# Patient Record
Sex: Male | Born: 1970 | Race: White | Hispanic: No | Marital: Married | Smoking: Never smoker
Health system: Southern US, Community
[De-identification: ages and names within clinical notes are randomized; demographics above are authoritative.]

## PROBLEM LIST (undated history)

## (undated) DIAGNOSIS — K760 Fatty (change of) liver, not elsewhere classified: Secondary | ICD-10-CM

## (undated) DIAGNOSIS — Z8719 Personal history of other diseases of the digestive system: Secondary | ICD-10-CM

## (undated) DIAGNOSIS — Z87442 Personal history of urinary calculi: Secondary | ICD-10-CM

## (undated) DIAGNOSIS — E7800 Pure hypercholesterolemia, unspecified: Secondary | ICD-10-CM

## (undated) DIAGNOSIS — I10 Essential (primary) hypertension: Secondary | ICD-10-CM

## (undated) HISTORY — PX: HERNIA REPAIR: SHX51

## (undated) HISTORY — PX: GANGLION CYST EXCISION: SHX1691

---

## 2005-12-09 ENCOUNTER — Ambulatory Visit: Payer: Self-pay | Admitting: Unknown Physician Specialty

## 2007-04-02 ENCOUNTER — Emergency Department: Payer: Self-pay | Admitting: Emergency Medicine

## 2007-04-10 ENCOUNTER — Ambulatory Visit: Payer: Self-pay | Admitting: Urology

## 2007-06-15 ENCOUNTER — Ambulatory Visit: Payer: Self-pay | Admitting: Gastroenterology

## 2009-03-08 IMAGING — CR DG ABDOMEN 1V
1 series · 2 of 2 positions shown · non-contrast
Comparison: none

REASON FOR EXAM: ureteral calculus pt need films
COMMENTS:

PROCEDURE:     DXR - DXR KIDNEY URETER BLADDER  - April 10, 2007 [DATE]
RESULT:     The patient has had a 2 mm distal LEFT ureteral stone
demonstrated at prior CT. On current plain film examination, no definite
renal or ureteral stones are identified.

[Series 1: view not recorded · 0.17mm/px · 2 of 2 slices shown]
[im 1/2]
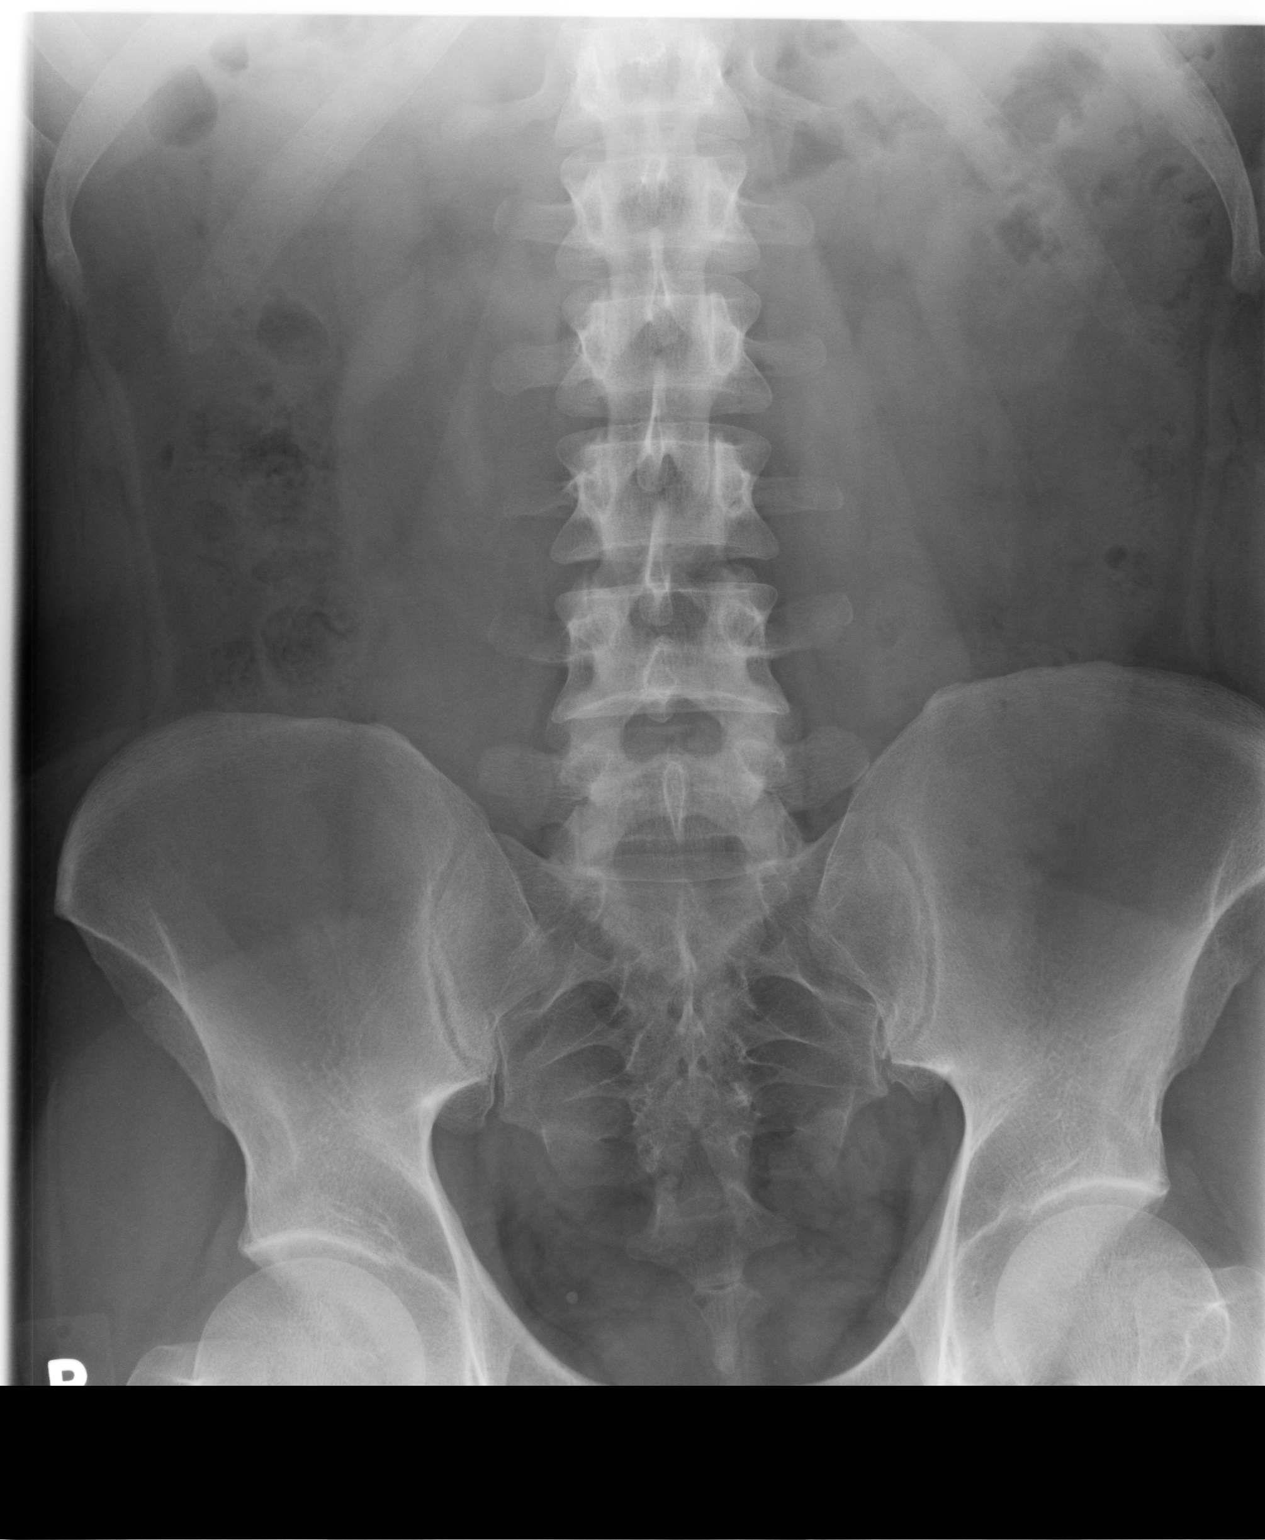
[im 2/2]
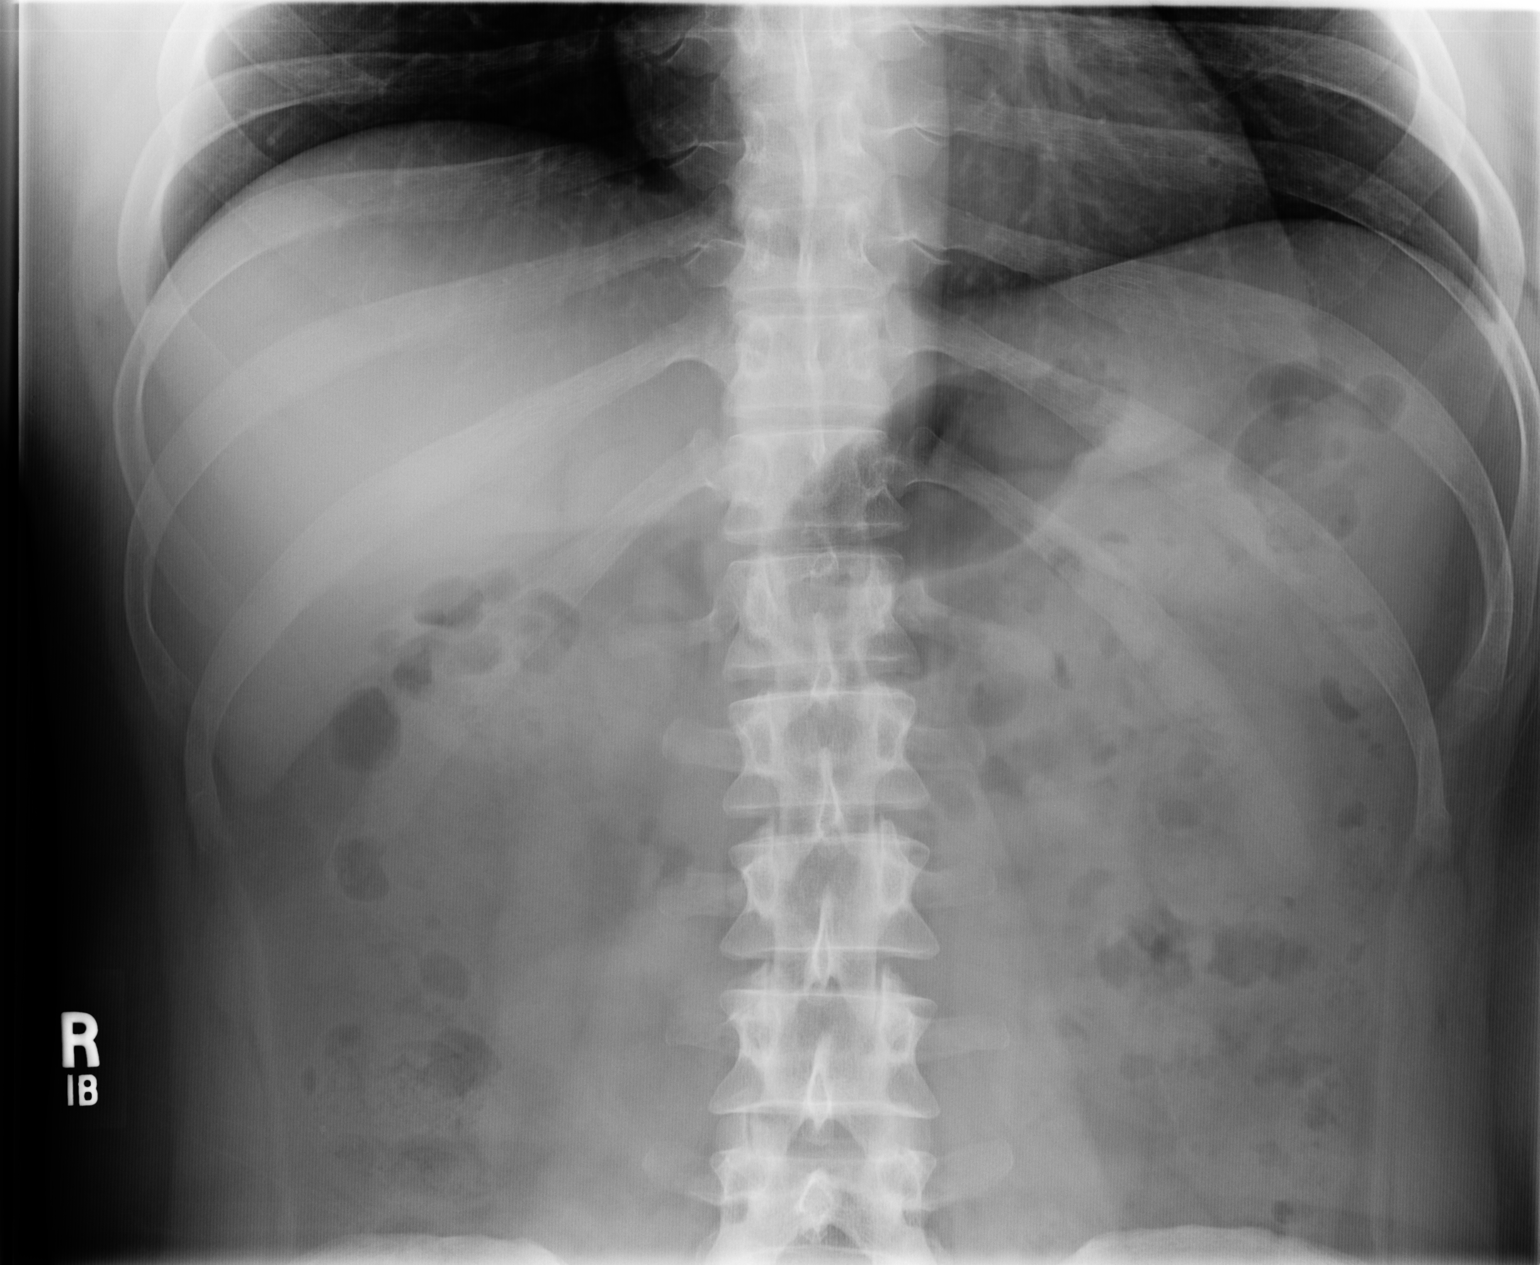

[2 of 2 positions shown; findings below may reference images not displayed]

IMPRESSION: 1.     Please see above.

## 2009-05-13 IMAGING — US ABDOMEN ULTRASOUND
1 series · 17 of 25 positions shown · non-contrast
Comparison: none

REASON FOR EXAM: Evaluate liver functions
COMMENTS:

[Series 1: abdomen ultrasound · 17 of 70 slices shown]
[im 1/70]
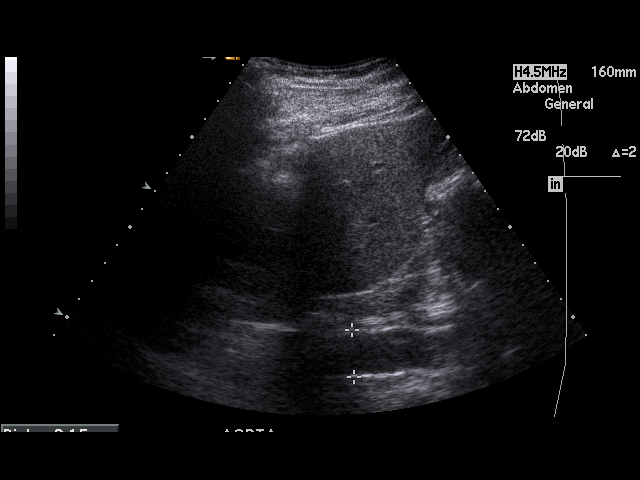
[im 6/70]
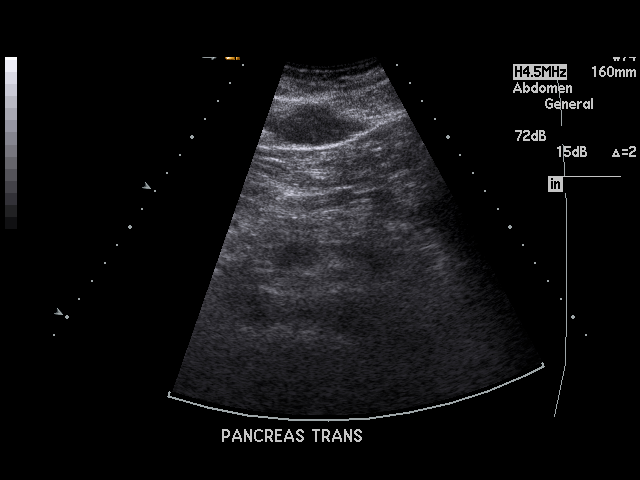
[im 9/70]
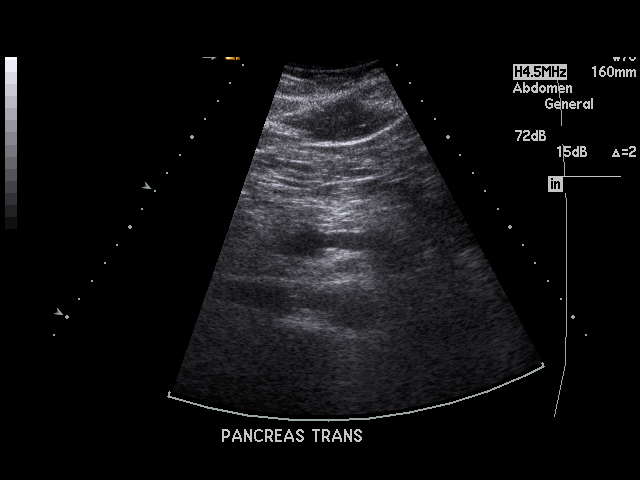
[im 15/70]
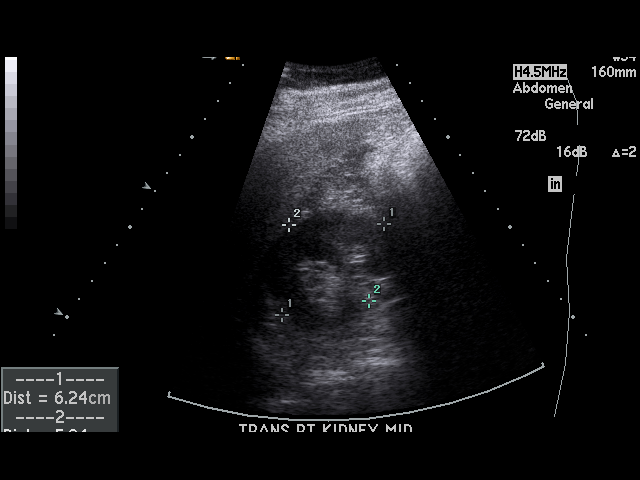
[im 18/70]
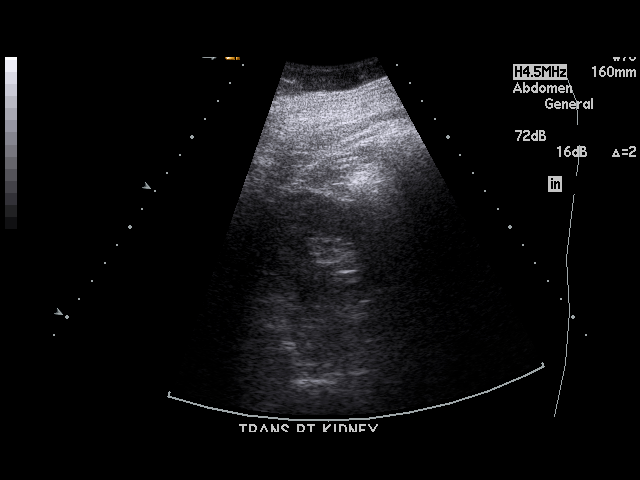
[im 24/70]
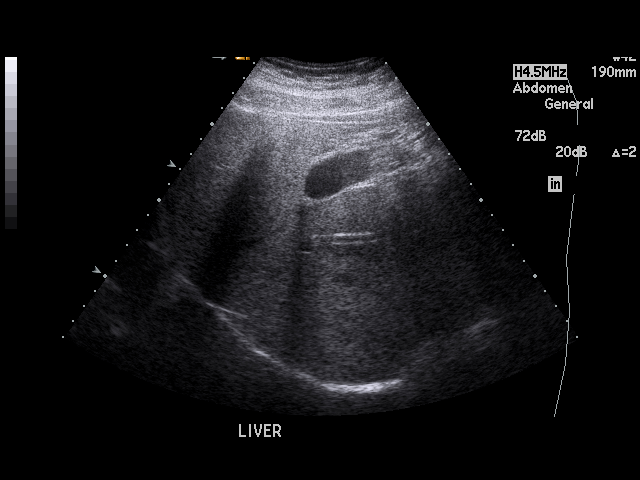
[im 26/70]
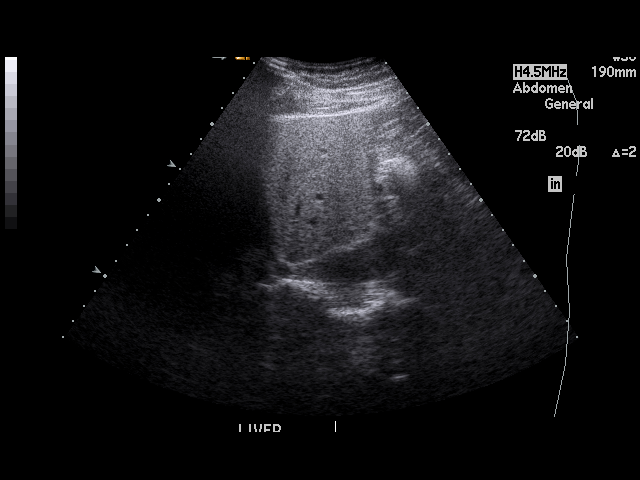
[im 32/70]
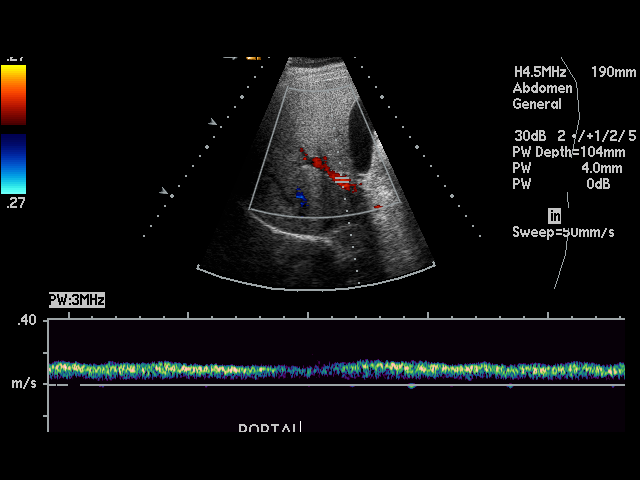
[im 35/70]
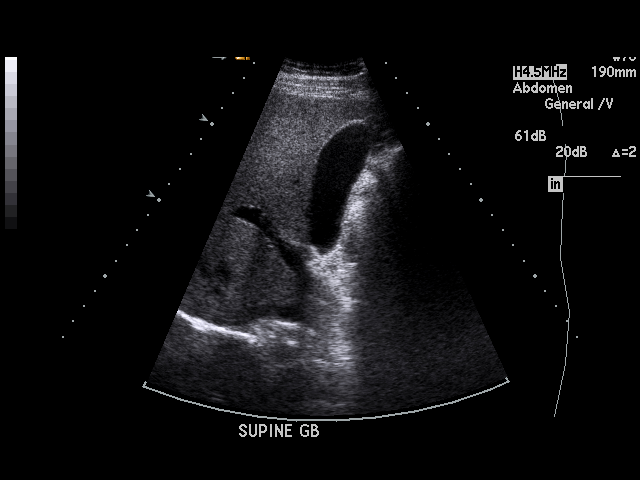
[im 38/70]
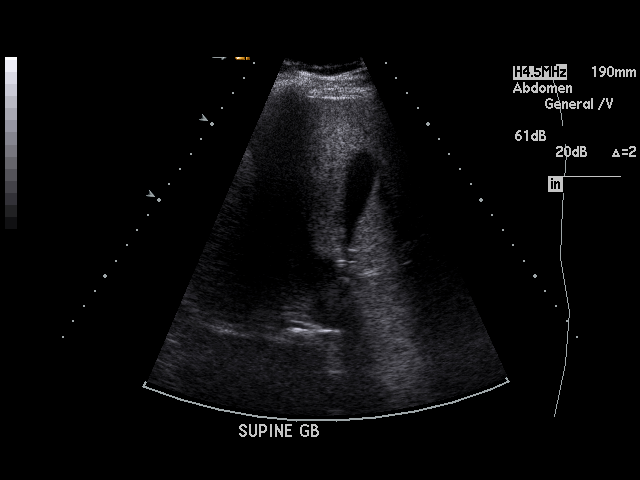
[im 44/70]
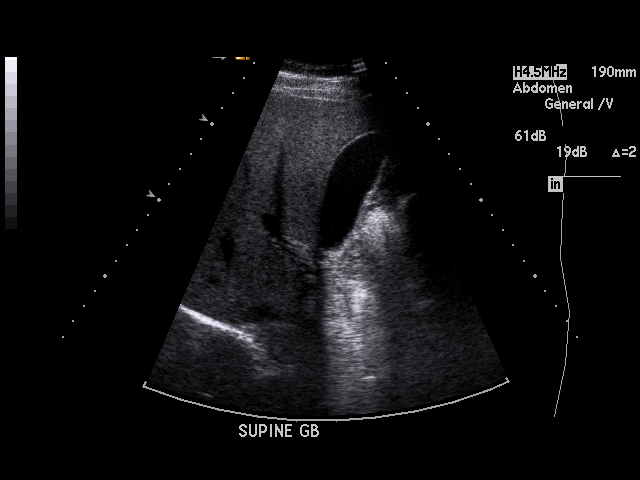
[im 47/70]
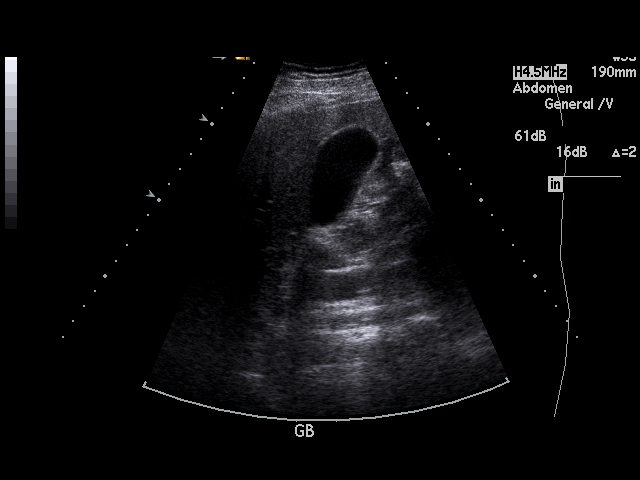
[im 52/70]
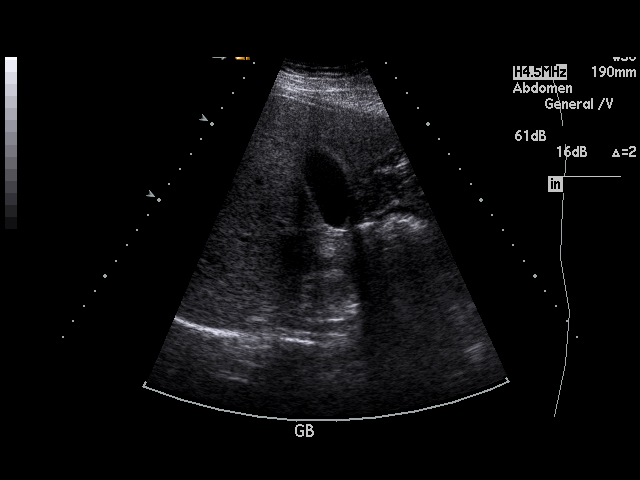
[im 55/70]
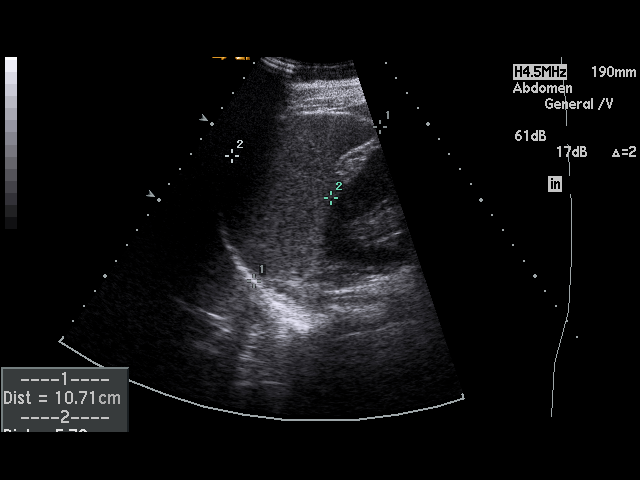
[im 61/70]
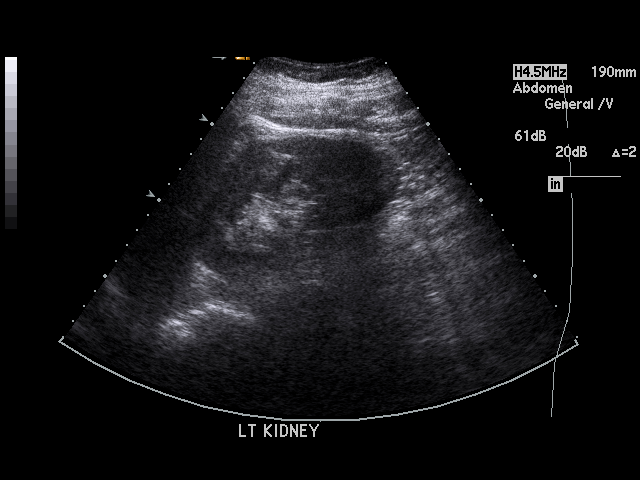
[im 64/70]
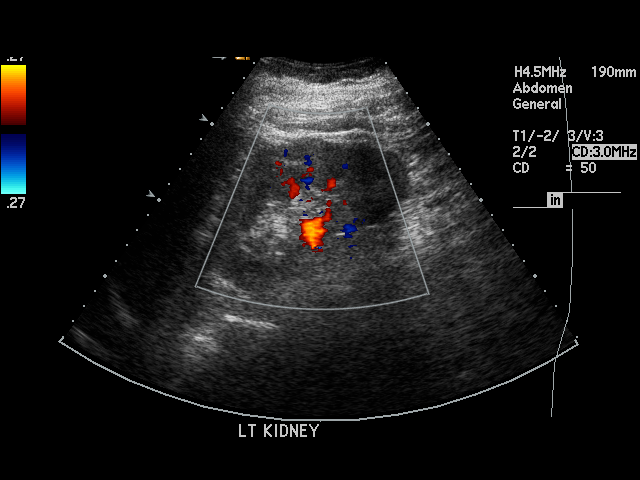
[im 70/70]
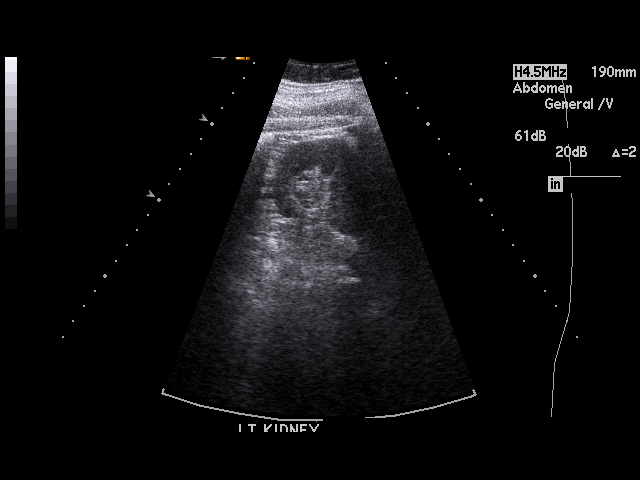

[17 of 25 positions shown; findings below may reference images not displayed]

PROCEDURE:     US  - US ABDOMEN GENERAL SURVEY  - June 15, 2007  [DATE]

RESULT:     The liver and spleen show no significant abnormalities. Portions
of the abdominal aorta are obscured but the visualized portions show no
significant abnormalities. No significant abnormalities of the inferior cava
are seen. The pancreas is not visualized adequately for evaluation on this
exam. No gallstones are seen. There is no thickening of the gallbladder
wall. The common bile duct measures 4.3 mm in diameter which is within
normal limits. The kidneys show no hydronephrosis. There is no ascites.
IMPRESSION: 1.  The pancreatic tail and portions of the abdominal aorta are obscured and
not fully visualized on this exam.
2.  No gallstones are seen.
3.  The kidneys show no hydronephrosis.
4.  There is no ascites.

## 2017-06-03 ENCOUNTER — Other Ambulatory Visit: Payer: Self-pay | Admitting: Nurse Practitioner

## 2017-06-03 DIAGNOSIS — R7989 Other specified abnormal findings of blood chemistry: Secondary | ICD-10-CM

## 2017-06-03 DIAGNOSIS — R945 Abnormal results of liver function studies: Principal | ICD-10-CM

## 2017-06-09 ENCOUNTER — Ambulatory Visit
Admission: RE | Admit: 2017-06-09 | Discharge: 2017-06-09 | Disposition: A | Discharge status: Home / Self Care | Payer: BLUE CROSS/BLUE SHIELD | Source: Ambulatory Visit | Attending: Nurse Practitioner | Admitting: Nurse Practitioner

## 2017-06-09 DIAGNOSIS — R945 Abnormal results of liver function studies: Secondary | ICD-10-CM | POA: Insufficient documentation

## 2017-06-09 DIAGNOSIS — R932 Abnormal findings on diagnostic imaging of liver and biliary tract: Secondary | ICD-10-CM | POA: Insufficient documentation

## 2017-06-09 DIAGNOSIS — R7989 Other specified abnormal findings of blood chemistry: Secondary | ICD-10-CM

## 2017-06-09 DIAGNOSIS — K8020 Calculus of gallbladder without cholecystitis without obstruction: Secondary | ICD-10-CM | POA: Diagnosis not present

## 2019-08-24 ENCOUNTER — Other Ambulatory Visit: Payer: Self-pay | Admitting: Nurse Practitioner

## 2019-08-24 DIAGNOSIS — K8011 Calculus of gallbladder with chronic cholecystitis with obstruction: Secondary | ICD-10-CM

## 2019-08-24 DIAGNOSIS — R1011 Right upper quadrant pain: Secondary | ICD-10-CM

## 2019-08-26 ENCOUNTER — Other Ambulatory Visit: Payer: Self-pay

## 2019-08-26 ENCOUNTER — Ambulatory Visit
Admission: RE | Admit: 2019-08-26 | Discharge: 2019-08-26 | Disposition: A | Discharge status: Home / Self Care | Payer: BC Managed Care – PPO | Source: Ambulatory Visit | Attending: Nurse Practitioner | Admitting: Nurse Practitioner

## 2019-08-26 DIAGNOSIS — R1011 Right upper quadrant pain: Secondary | ICD-10-CM | POA: Diagnosis present

## 2019-08-26 DIAGNOSIS — K8011 Calculus of gallbladder with chronic cholecystitis with obstruction: Secondary | ICD-10-CM | POA: Diagnosis present

## 2019-09-10 ENCOUNTER — Ambulatory Visit: Payer: Self-pay | Admitting: General Surgery

## 2019-09-10 NOTE — H&P (View-Only) (Signed)
PATIENT PROFILE: Brian Durham is a 49 y.o. male who presents to the Clinic for consultation at the request of Dr. Dayton Durham for evaluation of cholelithiasis.  PCP:  Brian Lange, NP  HISTORY OF PRESENT ILLNESS: Brian Durham reports having right upper quadrant pain since 22-monthago.  He reported that he has been having abdominal pain since few years ago but in the past few months he has been getting more frequently.  He also reports that the pain now is more localized in the right upper quadrant.  It starts on the epigastric area and radiates to the right upper quadrant.  He has identify aggravating factors such as big meals with marinara sauce.  There has been no alleviating factor.  Last episode was 2 weeks ago.  He reported that it was severe on the right upper quadrant and it lasted for 3 hours.  Tums, Pepto-Bismol and bicarbonate did not improve the pain.  Denies any nausea or vomiting.  Sometimes this vomiting makes the pain better but not that time.  Denies diarrhea.   PROBLEM LIST:         Problem List  Date Reviewed: 08/24/2019         Noted   Elevated LFTs 07/23/2015   Hypertension Unknown      GENERAL REVIEW OF SYSTEMS:   General ROS: negative for - chills, fatigue, fever, weight gain or weight loss Allergy and Immunology ROS: negative for - hives  Hematological and Lymphatic ROS: negative for - bleeding problems or bruising, negative for palpable nodes Endocrine ROS: negative for - heat or cold intolerance, hair changes Respiratory ROS: negative for - cough, shortness of breath or wheezing Cardiovascular ROS: no chest pain or palpitations GI ROS: negative for nausea, vomiting, diarrhea, constipation.  Positive for abdominal pain Musculoskeletal ROS: negative for - joint swelling or muscle pain Neurological ROS: negative for - confusion, syncope Dermatological ROS: negative for pruritus and rash Psychiatric: negative for anxiety, depression, difficulty sleeping  and memory loss  MEDICATIONS: Current Medications        Current Outpatient Medications  Medication Sig Dispense Refill  . lisinopriL (ZESTRIL) 10 MG tablet Take 1 tablet (10 mg total) by mouth once daily 90 tablet 3  . omeprazole (PRILOSEC) 40 MG DR capsule Take 1 capsule (40 mg total) by mouth once daily 90 capsule 0   No current facility-administered medications for this visit.       ALLERGIES: Patient has no known allergies.  PAST MEDICAL HISTORY:     Past Medical History:  Diagnosis Date  . Allergic rhinitis   . Elevated LFTs    +FANA.  Neg anti-smooth muscle antibody, antimitochondrial antibody.  Neg hepatitis markers.  . Elevated LFTs 2017  . Hypertension   . Hypertriglyceridemia   . Nephrolithiasis     PAST SURGICAL HISTORY:      Past Surgical History:  Procedure Laterality Date  . EXCISION GANGLION CYST WRIST PRIMARY Right   . HERNIA REPAIR       FAMILY HISTORY:      Family History  Problem Relation Age of Onset  . High blood pressure (Hypertension) Father   . Myocardial Infarction (Heart attack) Father        767,82 . High blood pressure (Hypertension) Mother   . Myocardial Infarction (Heart attack) Paternal Grandmother      SOCIAL HISTORY: Social History          Socioeconomic History  . Marital status: Married    Spouse name: Brian Durham .  Number of children: 1  . Years of education: Not on file  . Highest education level: Not on file  Occupational History  . 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 Financial resource strain: Not on file  . Food insecurity    Worry: Not on file    Inability: Not on file  . Transportation needs    Medical: Not on file    Non-medical: Not on file  Tobacco Use  . Smoking status: Never Smoker  . Smokeless tobacco: Never Used  Substance and Sexual Activity  . Alcohol use: No    Alcohol/week: 0.0 - 2.0 standard drinks  . Drug use: Never   . Sexual activity: Yes    Partners: Female    Birth control/protection: Condom, OCP  Other Topics Concern  . Not on file  Social History Narrative   Loves fishing & spends a lot of time fishing, on the water and snorkeling/scuba diving.      PHYSICAL EXAM:    Vitals:   09/10/19 0854  BP: 125/88  Pulse: 68   Body mass index is 26.78 kg/m. Weight: 87.1 kg (192 lb)   GENERAL: Alert, active, oriented x3  HEENT: Pupils equal reactive to light. Extraocular movements are intact. Sclera clear. Palpebral conjunctiva normal red color.Pharynx clear.  NECK: Supple with no palpable mass and no adenopathy.  LUNGS: Sound clear with no rales rhonchi or wheezes.  HEART: Regular rhythm S1 and S2 without murmur.  ABDOMEN: Soft and depressible, nontender with no palpable mass, no hepatomegaly.   EXTREMITIES: Well-developed well-nourished symmetrical with no dependent edema.  NEUROLOGICAL: Awake alert oriented, facial expression symmetrical, moving all extremities.  REVIEW OF DATA: I have reviewed the following data today:      Office Visit on 08/18/2019  Component Date Value  . WBC (White Blood Cell Co* 08/18/2019 5.3   . RBC (Red Blood Cell Coun* 08/18/2019 4.69   . Hemoglobin 08/18/2019 14.8   . Hematocrit 08/18/2019 43.7   . MCV (Mean Corpuscular Vo* 08/18/2019 93.2   . MCH (Mean Corpuscular He* 08/18/2019 31.6*  . MCHC (Mean Corpuscular H* 08/18/2019 33.9   . Platelet Count 08/18/2019 307   . RDW-CV (Red Cell Distrib* 08/18/2019 12.5   . MPV (Mean Platelet Volum* 08/18/2019 8.1*  . Neutrophils 08/18/2019 2.70   . Lymphocytes 08/18/2019 2.00   . Mixed Count 08/18/2019 0.60   . Neutrophil % 08/18/2019 51.0   . Lymphocyte % 08/18/2019 37.5   . Mixed % 08/18/2019 11.5   . Glucose 08/18/2019 98   . Sodium 08/18/2019 138   . Potassium 08/18/2019 4.8   . Chloride 08/18/2019 103   . Carbon Dioxide (CO2) 08/18/2019 28.9   . Urea Nitrogen (BUN) 08/18/2019 10    . Creatinine 08/18/2019 0.8   . Glomerular Filtration Ra* 08/18/2019 102   . Calcium 08/18/2019 10.0   . AST  08/18/2019 37   . ALT  08/18/2019 63*  . Alk Phos (alkaline Phosp* 08/18/2019 70   . Albumin 08/18/2019 4.9*  . Bilirubin, Total 08/18/2019 0.5   . Protein, Total 08/18/2019 7.4   . A/G Ratio 08/18/2019 2.0   . Cholesterol, Total 08/18/2019 207*  . Triglyceride 08/18/2019 132   . HDL (High Density Lipopr* 08/18/2019 58.9   . LDL Calculated 08/18/2019 122   . VLDL Cholesterol 08/18/2019 26   . Cholesterol/HDL Ratio 08/18/2019 3.5   . HCV Ab - [facility] 08/18/2019 <0.1   .   Hemoglobin A1C 08/18/2019 5.8*  . Average Blood Glucose (C* 08/18/2019 120   . Comment: - [facility] 08/18/2019 Comment      ASSESSMENT: Mr. Miah is a 49 y.o. male presenting for consultation for cholelithiasis.    Patient was oriented about the diagnosis of cholelithiasis. Also oriented about what is the gallbladder, its anatomy and function and the implications of having stones. The patient was oriented about the treatment alternatives (observation vs cholecystectomy). Patient was oriented that a Durham percentage of patient will continue to have similar pain symptoms even after the gallbladder is removed. Surgical technique (open vs laparoscopic) was discussed. It was also discussed the goals of the surgery (decrease the pain episodes and avoid the risk of cholecystitis) and the risk of surgery including: bleeding, infection, common bile duct injury, stone retention, injury to other organs such as bowel, liver, stomach, other complications such as hernia, bowel obstruction among others. Also discussed with patient about anesthesia and its complications such as: reaction to medications, pneumonia, heart complications, death, among others.  Cholelithiasis without cholecystitis [K80.20]  PLAN: 1. Robotic assisted laparoscopic cholecystectomy (47562) 2.  CBC, CMP done on 08/18/2019 3.  Do not take aspirin 5  days before the procedure 4.  Contact us if has any question or concern.  Patient verbalized understanding, all questions were answered, and were agreeable with the plan outlined above.     Amiria Orrison Cintron-Diaz, MD  Electronically signed by Yuleni Burich Cintron-Diaz, MD  

## 2019-09-10 NOTE — H&P (Signed)
PATIENT PROFILE: Brian Durham is a 49 y.o. male who presents to the Clinic for consultation at the request of Dr. Dayton Martes for evaluation of cholelithiasis.  PCP:  Sallee Lange, NP  HISTORY OF PRESENT ILLNESS: Brian Durham reports having right upper quadrant pain since 26-monthago.  He reported that he has been having abdominal pain since few years ago but in the past few months he has been getting more frequently.  He also reports that the pain now is more localized in the right upper quadrant.  It starts on the epigastric area and radiates to the right upper quadrant.  He has identify aggravating factors such as big meals with marinara sauce.  There has been no alleviating factor.  Last episode was 2 weeks ago.  He reported that it was severe on the right upper quadrant and it lasted for 3 hours.  Tums, Pepto-Bismol and bicarbonate did not improve the pain.  Denies any nausea or vomiting.  Sometimes this vomiting makes the pain better but not that time.  Denies diarrhea.   PROBLEM LIST:         Problem List  Date Reviewed: 08/24/2019         Noted   Elevated LFTs 07/23/2015   Hypertension Unknown      GENERAL REVIEW OF SYSTEMS:   General ROS: negative for - chills, fatigue, fever, weight gain or weight loss Allergy and Immunology ROS: negative for - hives  Hematological and Lymphatic ROS: negative for - bleeding problems or bruising, negative for palpable nodes Endocrine ROS: negative for - heat or cold intolerance, hair changes Respiratory ROS: negative for - cough, shortness of breath or wheezing Cardiovascular ROS: no chest pain or palpitations GI ROS: negative for nausea, vomiting, diarrhea, constipation.  Positive for abdominal pain Musculoskeletal ROS: negative for - joint swelling or muscle pain Neurological ROS: negative for - confusion, syncope Dermatological ROS: negative for pruritus and rash Psychiatric: negative for anxiety, depression, difficulty sleeping  and memory loss  MEDICATIONS: Current Medications        Current Outpatient Medications  Medication Sig Dispense Refill  . lisinopriL (ZESTRIL) 10 MG tablet Take 1 tablet (10 mg total) by mouth once daily 90 tablet 3  . omeprazole (PRILOSEC) 40 MG DR capsule Take 1 capsule (40 mg total) by mouth once daily 90 capsule 0   No current facility-administered medications for this visit.       ALLERGIES: Patient has no known allergies.  PAST MEDICAL HISTORY:     Past Medical History:  Diagnosis Date  . Allergic rhinitis   . Elevated LFTs    +FANA.  Neg anti-smooth muscle antibody, antimitochondrial antibody.  Neg hepatitis markers.  . Elevated LFTs 2017  . Hypertension   . Hypertriglyceridemia   . Nephrolithiasis     PAST SURGICAL HISTORY:      Past Surgical History:  Procedure Laterality Date  . EXCISION GANGLION CYST WRIST PRIMARY Right   . HERNIA REPAIR       FAMILY HISTORY:      Family History  Problem Relation Age of Onset  . High blood pressure (Hypertension) Father   . Myocardial Infarction (Heart attack) Father        759,82 . High blood pressure (Hypertension) Mother   . Myocardial Infarction (Heart attack) Paternal Grandmother      SOCIAL HISTORY: Social History          Socioeconomic History  . Marital status: Married    Spouse name: CAlyse Low .  Number of children: 1  . Years of education: Not on file  . Highest education level: Not on file  Occupational History  . 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 Financial resource strain: Not on file  . Food insecurity    Worry: Not on file    Inability: Not on file  . Transportation needs    Medical: Not on file    Non-medical: Not on file  Tobacco Use  . Smoking status: Never Smoker  . Smokeless tobacco: Never Used  Substance and Sexual Activity  . Alcohol use: No    Alcohol/week: 0.0 - 2.0 standard drinks  . Drug use: Never   . Sexual activity: Yes    Partners: Female    Birth control/protection: Condom, OCP  Other Topics Concern  . Not on file  Social History Narrative   Loves fishing & spends a lot of time fishing, on the water and snorkeling/scuba diving.      PHYSICAL EXAM:    Vitals:   09/10/19 0854  BP: 125/88  Pulse: 68   Body mass index is 26.78 kg/m. Weight: 87.1 kg (192 lb)   GENERAL: Alert, active, oriented x3  HEENT: Pupils equal reactive to light. Extraocular movements are intact. Sclera clear. Palpebral conjunctiva normal red color.Pharynx clear.  NECK: Supple with no palpable mass and no adenopathy.  LUNGS: Sound clear with no rales rhonchi or wheezes.  HEART: Regular rhythm S1 and S2 without murmur.  ABDOMEN: Soft and depressible, nontender with no palpable mass, no hepatomegaly.   EXTREMITIES: Well-developed well-nourished symmetrical with no dependent edema.  NEUROLOGICAL: Awake alert oriented, facial expression symmetrical, moving all extremities.  REVIEW OF DATA: I have reviewed the following data today:      Office Visit on 08/18/2019  Component Date Value  . WBC (White Blood Cell Co* 08/18/2019 5.3   . RBC (Red Blood Cell Coun* 08/18/2019 4.69   . Hemoglobin 08/18/2019 14.8   . Hematocrit 08/18/2019 43.7   . MCV (Mean Corpuscular Vo* 08/18/2019 93.2   . MCH (Mean Corpuscular He* 08/18/2019 31.6*  . MCHC (Mean Corpuscular H* 08/18/2019 33.9   . Platelet Count 08/18/2019 307   . RDW-CV (Red Cell Distrib* 08/18/2019 12.5   . MPV (Mean Platelet Volum* 08/18/2019 8.1*  . Neutrophils 08/18/2019 2.70   . Lymphocytes 08/18/2019 2.00   . Mixed Count 08/18/2019 0.60   . Neutrophil % 08/18/2019 51.0   . Lymphocyte % 08/18/2019 37.5   . Mixed % 08/18/2019 11.5   . Glucose 08/18/2019 98   . Sodium 08/18/2019 138   . Potassium 08/18/2019 4.8   . Chloride 08/18/2019 103   . Carbon Dioxide (CO2) 08/18/2019 28.9   . Urea Nitrogen (BUN) 08/18/2019 10    . Creatinine 08/18/2019 0.8   . Glomerular Filtration Ra* 08/18/2019 102   . Calcium 08/18/2019 10.0   . AST  08/18/2019 37   . ALT  08/18/2019 63*  . Alk Phos (alkaline Phosp* 08/18/2019 70   . Albumin 08/18/2019 4.9*  . Bilirubin, Total 08/18/2019 0.5   . Protein, Total 08/18/2019 7.4   . A/G Ratio 08/18/2019 2.0   . Cholesterol, Total 08/18/2019 207*  . Triglyceride 08/18/2019 132   . HDL (High Density Lipopr* 08/18/2019 58.9   . LDL Calculated 08/18/2019 122   . VLDL Cholesterol 08/18/2019 26   . Cholesterol/HDL Ratio 08/18/2019 3.5   . HCV Ab - [facility] 08/18/2019 <0.1   .   Hemoglobin A1C 08/18/2019 5.8*  . Average Blood Glucose (C* 08/18/2019 120   . Comment: - [facility] 08/18/2019 Comment      ASSESSMENT: Mr. Tigert is a 49 y.o. male presenting for consultation for cholelithiasis.    Patient was oriented about the diagnosis of cholelithiasis. Also oriented about what is the gallbladder, its anatomy and function and the implications of having stones. The patient was oriented about the treatment alternatives (observation vs cholecystectomy). Patient was oriented that a low percentage of patient will continue to have similar pain symptoms even after the gallbladder is removed. Surgical technique (open vs laparoscopic) was discussed. It was also discussed the goals of the surgery (decrease the pain episodes and avoid the risk of cholecystitis) and the risk of surgery including: bleeding, infection, common bile duct injury, stone retention, injury to other organs such as bowel, liver, stomach, other complications such as hernia, bowel obstruction among others. Also discussed with patient about anesthesia and its complications such as: reaction to medications, pneumonia, heart complications, death, among others.  Cholelithiasis without cholecystitis [K80.20]  PLAN: 1. Robotic assisted laparoscopic cholecystectomy (73710) 2.  CBC, CMP done on 08/18/2019 3.  Do not take aspirin 5  days before the procedure 4.  Contact us if has any question or concern.  Patient verbalized understanding, all questions were answered, and were agreeable with the plan outlined above.     Herbert Pun, MD  Electronically signed by Herbert Pun, MD

## 2019-09-16 ENCOUNTER — Other Ambulatory Visit: Payer: Self-pay

## 2019-09-16 ENCOUNTER — Encounter
Admission: RE | Admit: 2019-09-16 | Discharge: 2019-09-16 | Disposition: A | Discharge status: Home / Self Care | Payer: BC Managed Care – PPO | Source: Ambulatory Visit | Attending: General Surgery | Admitting: General Surgery

## 2019-09-16 DIAGNOSIS — Z01818 Encounter for other preprocedural examination: Secondary | ICD-10-CM | POA: Diagnosis present

## 2019-09-16 NOTE — Patient Instructions (Addendum)
INSTRUCTIONS FOR SURGERY     Your surgery is scheduled for:   Wednesday, MARCH 3RD     To find out your arrival time for the day of surgery,          please call [phone] between 1 pm and 3 pm on :  Tuesday, MARCH 2ND     When you arrive for surgery, report to the SECOND FLOOR OF THE MEDICAL MALL.       Do NOT stop on the first floor to register.    REMEMBER: Instructions that are not followed completely may result in serious medical risk,  up to and including death, or upon the discretion of your surgeon and anesthesiologist,            your surgery may need to be rescheduled.  __X__ 1. Do not eat food after midnight the night before your procedure.                    No gum, candy, lozenger, tic tacs, tums or hard candies.                  ABSOLUTELY NOTHING SOLID IN YOUR MOUTH AFTER MIDNIGHT                    You may drink unlimited clear liquids up to 2 hours before you are scheduled to arrive for surgery.                   Do not drink anything within those 2 hours unless you need to take medicine, then take the                   smallest amount you need.  Clear liquids include:  water, apple juice without pulp,                   any flavor Gatorade, Black coffee, black tea.  Sugar may be added but no dairy/ honey /lemon.                        Broth and jello is not considered a clear liquid.  __x__  2. On the morning of surgery, please brush your teeth with toothpaste and water. You may rinse                   with mouthwash if you wish but DO NOT SWALLOW TOOTHPASTE OR MOUTHWASH  __X___3. NO alcohol for 24 hours before or after surgery.  __x___ 4.  Do NOT smoke or use e-cigarettes for 24 HOURS PRIOR TO SURGERY.                      DO NOT Use any chewable tobacco products for at least 6 hours prior to surgery.  __x___ 5. If you start any new medication after this appointment and prior to surgery, please                    Bring it with you on the day of surgery.  ___x__ 6. Notify your doctor if there is any change  in your medical condition, such as fever,                   infection, vomitting, diarrhea or any open sores.  __x___ 7.  USE the CHG SOAP as instructed, the night before surgery and the day of surgery.                   Once you have washed with this soap, do NOT use any of the following: Powders, perfumes                    or lotions. Please do not wear make up, hairpins, clips or nail polish. You may wear deodorant.                   Men may shave their face and neck.  Women need to shave 48 hours prior to surgery.                   DO NOT wear ANY jewelry on the day of surgery. If there are rings that are too tight to                    remove easily, please address this prior to the surgery day. Piercings need to be removed.                                                                     NO METAL ON YOUR BODY.                    Do NOT bring any valuables.  If you came to Pre-Admit testing then you will not need license,                     insurance card or credit card.  If you will be staying overnight, please either leave your things in                     the car or have your family be responsible for these items.                     [facility] IS NOT RESPONSIBLE FOR BELONGINGS OR VALUABLES.  ___X__ 8. DO NOT wear contact lenses on surgery day.  You may not have dentures,                     Hearing aides, contacts or glasses in the operating room. These items can be                    Placed in the Recovery Room to receive immediately after surgery.  __x___ 9. IF YOU ARE SCHEDULED TO GO HOME ON THE SAME DAY, YOU MUST                   Have someone to drive you home and to stay with you  for the first 24 hours.                    Have an arrangement prior to arriving on surgery day.  ___x__ 10. Take the following medications  on the morning of surgery with a sip of water:                               1.  NOTHING                     2.                     3.                  _____ 11.  Follow any instructions provided to you by your surgeon.                        Such as enema, clear liquid bowel prep  __X__  12. STOP  ASPIRIN AS OF:  Thursday, February 25TH                       THIS INCLUDES BC POWDERS / GOODIES POWDER  __x___ 13. STOP Anti-inflammatories as of: Thursday, February 25TH                      This includes IBUPROFEN / MOTRIN / ADVIL / ALEVE/ NAPROXYN                    YOU MAY TAKE TYLENOL ANY TIME PRIOR TO SURGERY.  __X___ 78.  Stop supplements until after surgery.                     This includes:  MULTIVITAMINS                 You may continue taking Vitamin B12 / Vitamin D3 but do not take on the morning of surgery.  ___x___17.  Continue to take the following medications but do not take on the morning of surgery:                            LISINOPRIL // VITAMIN B12   ___x___18.   Wear clean and comfortable clothing to the hospital.  Have stretchy pants on.  Consider having stool softeners at home and begin 2 days prior to surgery. You will need this if you     Take pain medication on a regular basis after surgery. Have a firm pillow to hold against your abdomen when coughing, sneezing, laughing (after surgery)

## 2019-09-17 ENCOUNTER — Other Ambulatory Visit
Admission: RE | Admit: 2019-09-17 | Discharge: 2019-09-17 | Disposition: A | Discharge status: Home / Self Care | Payer: BC Managed Care – PPO | Source: Ambulatory Visit | Attending: General Surgery | Admitting: General Surgery

## 2019-09-17 HISTORY — DX: Essential (primary) hypertension: I10

## 2019-09-17 HISTORY — DX: Fatty (change of) liver, not elsewhere classified: K76.0

## 2019-09-17 HISTORY — DX: Pure hypercholesterolemia, unspecified: E78.00

## 2019-09-17 HISTORY — DX: Personal history of other diseases of the digestive system: Z87.19

## 2019-09-17 HISTORY — DX: Personal history of urinary calculi: Z87.442

## 2019-09-20 ENCOUNTER — Other Ambulatory Visit: Payer: Self-pay

## 2019-09-20 ENCOUNTER — Encounter
Admission: RE | Admit: 2019-09-20 | Discharge: 2019-09-20 | Disposition: A | Discharge status: Home / Self Care | Payer: BC Managed Care – PPO | Source: Ambulatory Visit | Attending: General Surgery | Admitting: General Surgery

## 2019-09-20 DIAGNOSIS — Z20822 Contact with and (suspected) exposure to covid-19: Secondary | ICD-10-CM | POA: Diagnosis not present

## 2019-09-20 DIAGNOSIS — I10 Essential (primary) hypertension: Secondary | ICD-10-CM | POA: Insufficient documentation

## 2019-09-20 DIAGNOSIS — Z01818 Encounter for other preprocedural examination: Secondary | ICD-10-CM | POA: Insufficient documentation

## 2019-09-21 LAB — SARS CORONAVIRUS 2 (TAT 6-24 HRS): SARS Coronavirus 2: NEGATIVE

## 2019-09-21 MED ORDER — INDOCYANINE GREEN 25 MG IV SOLR
1.2500 mg | Freq: Once | INTRAVENOUS | Status: DC
  Filled 2019-09-21: qty 10

## 2019-09-22 ENCOUNTER — Encounter: Payer: Self-pay | Admitting: General Surgery

## 2019-09-22 ENCOUNTER — Ambulatory Visit: Payer: BC Managed Care – PPO | Admitting: Registered Nurse

## 2019-09-22 ENCOUNTER — Ambulatory Visit
Admission: RE | Admit: 2019-09-22 | Discharge: 2019-09-22 | Disposition: A | Discharge status: Home / Self Care | Payer: BC Managed Care – PPO | Attending: General Surgery | Admitting: General Surgery

## 2019-09-22 ENCOUNTER — Other Ambulatory Visit: Payer: Self-pay

## 2019-09-22 ENCOUNTER — Encounter
Admission: RE | Disposition: A | Discharge status: Home / Self Care | Payer: Self-pay | Source: Home / Self Care | Attending: General Surgery

## 2019-09-22 DIAGNOSIS — K8010 Calculus of gallbladder with chronic cholecystitis without obstruction: Secondary | ICD-10-CM | POA: Diagnosis present

## 2019-09-22 DIAGNOSIS — I10 Essential (primary) hypertension: Secondary | ICD-10-CM | POA: Diagnosis not present

## 2019-09-22 DIAGNOSIS — Z79899 Other long term (current) drug therapy: Secondary | ICD-10-CM | POA: Diagnosis not present

## 2019-09-22 SURGERY — CHOLECYSTECTOMY, ROBOT-ASSISTED, LAPAROSCOPIC
Anesthesia: General | Site: Abdomen

## 2019-09-22 MED ORDER — HYDROCODONE-ACETAMINOPHEN 5-325 MG PO TABS: 1.0000 | ORAL_TABLET | ORAL | 0 refills | Status: AC | PRN

## 2019-09-22 MED ORDER — FENTANYL CITRATE (PF) 100 MCG/2ML IJ SOLN
INTRAMUSCULAR | Status: AC
  Administered 2019-09-22: 25 ug via INTRAVENOUS
  Filled 2019-09-22: qty 2

## 2019-09-22 MED ORDER — SODIUM CHLORIDE 0.9 % IV SOLN
INTRAVENOUS | Status: DC | PRN
  Administered 2019-09-22: 25 ug/min via INTRAVENOUS

## 2019-09-22 MED ORDER — MEPERIDINE HCL 50 MG/ML IJ SOLN: 6.2500 mg | INTRAMUSCULAR | Status: DC | PRN

## 2019-09-22 MED ORDER — FENTANYL CITRATE (PF) 100 MCG/2ML IJ SOLN
INTRAMUSCULAR | Status: DC | PRN
  Administered 2019-09-22 (×3): 25 ug via INTRAVENOUS
  Administered 2019-09-22: 50 ug via INTRAVENOUS

## 2019-09-22 MED ORDER — OXYCODONE HCL 5 MG PO TABS
ORAL_TABLET | ORAL | Status: AC
  Filled 2019-09-22: qty 1

## 2019-09-22 MED ORDER — EPINEPHRINE PF 1 MG/ML IJ SOLN
INTRAMUSCULAR | Status: AC
  Filled 2019-09-22: qty 1

## 2019-09-22 MED ORDER — BUPIVACAINE HCL (PF) 0.25 % IJ SOLN
INTRAMUSCULAR | Status: AC
  Filled 2019-09-22: qty 30

## 2019-09-22 MED ORDER — SODIUM CHLORIDE (PF) 0.9 % IJ SOLN
INTRAMUSCULAR | Status: AC
  Filled 2019-09-22: qty 10

## 2019-09-22 MED ORDER — KETAMINE HCL 10 MG/ML IJ SOLN
INTRAMUSCULAR | Status: DC | PRN
  Administered 2019-09-22: 30 mg via INTRAVENOUS

## 2019-09-22 MED ORDER — ROCURONIUM BROMIDE 50 MG/5ML IV SOLN
INTRAVENOUS | Status: AC
  Filled 2019-09-22: qty 1

## 2019-09-22 MED ORDER — ONDANSETRON HCL 4 MG/2ML IJ SOLN
INTRAMUSCULAR | Status: DC | PRN
  Administered 2019-09-22: 4 mg via INTRAVENOUS

## 2019-09-22 MED ORDER — BUPIVACAINE-EPINEPHRINE 0.25% -1:200000 IJ SOLN
INTRAMUSCULAR | Status: DC | PRN
  Administered 2019-09-22: 20 mL
  Administered 2019-09-22: 10 mL

## 2019-09-22 MED ORDER — ACETAMINOPHEN 10 MG/ML IV SOLN
INTRAVENOUS | Status: AC
  Filled 2019-09-22: qty 100

## 2019-09-22 MED ORDER — ROCURONIUM BROMIDE 100 MG/10ML IV SOLN
INTRAVENOUS | Status: DC | PRN
  Administered 2019-09-22: 50 mg via INTRAVENOUS
  Administered 2019-09-22: 10 mg via INTRAVENOUS

## 2019-09-22 MED ORDER — PROPOFOL 10 MG/ML IV BOLUS
INTRAVENOUS | Status: DC | PRN
  Administered 2019-09-22: 150 mg via INTRAVENOUS

## 2019-09-22 MED ORDER — LIDOCAINE HCL (PF) 2 % IJ SOLN
INTRAMUSCULAR | Status: AC
  Filled 2019-09-22: qty 5

## 2019-09-22 MED ORDER — LACTATED RINGERS IV SOLN: INTRAVENOUS | Status: DC

## 2019-09-22 MED ORDER — CEFAZOLIN SODIUM-DEXTROSE 2-4 GM/100ML-% IV SOLN
INTRAVENOUS | Status: AC
  Filled 2019-09-22: qty 100

## 2019-09-22 MED ORDER — SUGAMMADEX SODIUM 200 MG/2ML IV SOLN
INTRAVENOUS | Status: DC | PRN
  Administered 2019-09-22: 200 mg via INTRAVENOUS

## 2019-09-22 MED ORDER — MIDAZOLAM HCL 2 MG/2ML IJ SOLN
INTRAMUSCULAR | Status: DC | PRN
  Administered 2019-09-22: 2 mg via INTRAVENOUS

## 2019-09-22 MED ORDER — MIDAZOLAM HCL 2 MG/2ML IJ SOLN
INTRAMUSCULAR | Status: AC
  Filled 2019-09-22: qty 2

## 2019-09-22 MED ORDER — ACETAMINOPHEN 10 MG/ML IV SOLN
INTRAVENOUS | Status: DC | PRN
  Administered 2019-09-22: 1000 mg via INTRAVENOUS

## 2019-09-22 MED ORDER — OXYCODONE HCL 5 MG PO TABS
5.0000 mg | ORAL_TABLET | Freq: Once | ORAL | Status: AC | PRN
  Administered 2019-09-22: 5 mg via ORAL

## 2019-09-22 MED ORDER — DEXAMETHASONE SODIUM PHOSPHATE 10 MG/ML IJ SOLN
INTRAMUSCULAR | Status: DC | PRN
  Administered 2019-09-22: 10 mg via INTRAVENOUS

## 2019-09-22 MED ORDER — SUGAMMADEX SODIUM 200 MG/2ML IV SOLN
INTRAVENOUS | Status: AC
  Filled 2019-09-22: qty 2

## 2019-09-22 MED ORDER — GLYCOPYRROLATE 0.2 MG/ML IJ SOLN
INTRAMUSCULAR | Status: DC | PRN
  Administered 2019-09-22: .2 mg via INTRAVENOUS

## 2019-09-22 MED ORDER — PROMETHAZINE HCL 25 MG/ML IJ SOLN: 6.2500 mg | INTRAMUSCULAR | Status: DC | PRN

## 2019-09-22 MED ORDER — LIDOCAINE HCL (CARDIAC) PF 100 MG/5ML IV SOSY
PREFILLED_SYRINGE | INTRAVENOUS | Status: DC | PRN
  Administered 2019-09-22: 100 mg via INTRAVENOUS

## 2019-09-22 MED ORDER — FAMOTIDINE 20 MG PO TABS: 20.0000 mg | ORAL_TABLET | Freq: Once | ORAL | Status: DC

## 2019-09-22 MED ORDER — PROPOFOL 10 MG/ML IV BOLUS
INTRAVENOUS | Status: AC
  Filled 2019-09-22: qty 20

## 2019-09-22 MED ORDER — PHENYLEPHRINE HCL (PRESSORS) 10 MG/ML IV SOLN
INTRAVENOUS | Status: DC | PRN
  Administered 2019-09-22: 50 ug via INTRAVENOUS
  Administered 2019-09-22: 100 ug via INTRAVENOUS

## 2019-09-22 MED ORDER — FENTANYL CITRATE (PF) 250 MCG/5ML IJ SOLN
INTRAMUSCULAR | Status: AC
  Filled 2019-09-22: qty 5

## 2019-09-22 MED ORDER — CEFAZOLIN SODIUM-DEXTROSE 2-4 GM/100ML-% IV SOLN
2.0000 g | INTRAVENOUS | Status: AC
  Administered 2019-09-22: 2 g via INTRAVENOUS

## 2019-09-22 MED ORDER — ONDANSETRON HCL 4 MG/2ML IJ SOLN
INTRAMUSCULAR | Status: AC
  Filled 2019-09-22: qty 2

## 2019-09-22 MED ORDER — OXYCODONE HCL 5 MG/5ML PO SOLN: 5.0000 mg | Freq: Once | ORAL | Status: AC | PRN

## 2019-09-22 MED ORDER — FENTANYL CITRATE (PF) 100 MCG/2ML IJ SOLN
25.0000 ug | INTRAMUSCULAR | Status: AC | PRN
  Administered 2019-09-22 (×4): 25 ug via INTRAVENOUS

## 2019-09-22 MED ORDER — EPHEDRINE SULFATE 50 MG/ML IJ SOLN
INTRAMUSCULAR | Status: DC | PRN
  Administered 2019-09-22 (×2): 5 mg via INTRAVENOUS

## 2019-09-22 MED ORDER — SODIUM CHLORIDE FLUSH 0.9 % IV SOLN
INTRAVENOUS | Status: AC
  Filled 2019-09-22: qty 10

## 2019-09-22 MED ORDER — DEXAMETHASONE SODIUM PHOSPHATE 10 MG/ML IJ SOLN
INTRAMUSCULAR | Status: AC
  Filled 2019-09-22: qty 1

## 2019-09-22 SURGICAL SUPPLY — 59 items
"PENCIL ELECTRO HAND CTR " (MISCELLANEOUS) IMPLANT
BLADE SURG SZ11 CARB STEEL (BLADE) ×4 IMPLANT
CANISTER SUCT 1200ML W/VALVE (MISCELLANEOUS) ×4 IMPLANT
CANNULA REDUC XI 12-8 STAPL (CANNULA) ×1
CANNULA REDUC XI 12-8MM STAPL (CANNULA) ×1
CANNULA REDUCER 12-8 DVNC XI (CANNULA) ×2 IMPLANT
CHLORAPREP W/TINT 26 (MISCELLANEOUS) ×4 IMPLANT
CLIP VESOLOCK MED LG 6/CT (CLIP) ×4 IMPLANT
COVER TIP SHEARS 8 DVNC (MISCELLANEOUS) IMPLANT
COVER TIP SHEARS 8MM DA VINCI (MISCELLANEOUS)
COVER WAND RF STERILE (DRAPES) ×4 IMPLANT
DECANTER SPIKE VIAL GLASS SM (MISCELLANEOUS) ×8 IMPLANT
DEFOGGER SCOPE WARMER CLEARIFY (MISCELLANEOUS) ×4 IMPLANT
DERMABOND ADVANCED (GAUZE/BANDAGES/DRESSINGS) ×2
DERMABOND ADVANCED .7 DNX12 (GAUZE/BANDAGES/DRESSINGS) ×2 IMPLANT
DRAPE ARM DVNC X/XI (DISPOSABLE) ×8 IMPLANT
DRAPE COLUMN DVNC XI (DISPOSABLE) ×2 IMPLANT
DRAPE DA VINCI XI ARM (DISPOSABLE) ×8
DRAPE DA VINCI XI COLUMN (DISPOSABLE) ×2
ELECT CAUTERY BLADE 6.4 (BLADE) IMPLANT
ELECT REM PT RETURN 9FT ADLT (ELECTROSURGICAL) ×4
ELECTRODE REM PT RTRN 9FT ADLT (ELECTROSURGICAL) ×2 IMPLANT
GLOVE BIO SURGEON STRL SZ 6.5 (GLOVE) ×6 IMPLANT
GLOVE BIO SURGEONS STRL SZ 6.5 (GLOVE) ×2
GLOVE BIOGEL PI IND STRL 6.5 (GLOVE) ×4 IMPLANT
GLOVE BIOGEL PI INDICATOR 6.5 (GLOVE) ×4
GOWN STRL REUS W/ TWL LRG LVL3 (GOWN DISPOSABLE) ×6 IMPLANT
GOWN STRL REUS W/TWL LRG LVL3 (GOWN DISPOSABLE) ×6
GRASPER SUT TROCAR 14GX15 (MISCELLANEOUS) ×2 IMPLANT
IRRIGATOR SUCT 8 DISP DVNC XI (IRRIGATION / IRRIGATOR) IMPLANT
IRRIGATOR SUCTION 8MM XI DISP (IRRIGATION / IRRIGATOR)
IV NS 1000ML (IV SOLUTION)
IV NS 1000ML BAXH (IV SOLUTION) IMPLANT
KIT PINK PAD W/HEAD ARE REST (MISCELLANEOUS) ×4
KIT PINK PAD W/HEAD ARM REST (MISCELLANEOUS) ×2 IMPLANT
LABEL OR SOLS (LABEL) ×4 IMPLANT
NDL INSUFFLATION 14GA 120MM (NEEDLE) ×2 IMPLANT
NEEDLE HYPO 22GX1.5 SAFETY (NEEDLE) ×4 IMPLANT
NEEDLE INSUFFLATION 14GA 120MM (NEEDLE) ×4 IMPLANT
NS IRRIG 500ML POUR BTL (IV SOLUTION) ×4 IMPLANT
OBTURATOR OPTICAL STANDARD 8MM (TROCAR) ×2
OBTURATOR OPTICAL STND 8 DVNC (TROCAR) ×2
OBTURATOR OPTICALSTD 8 DVNC (TROCAR) ×2 IMPLANT
PACK LAP CHOLECYSTECTOMY (MISCELLANEOUS) ×4 IMPLANT
PENCIL ELECTRO HAND CTR (MISCELLANEOUS) IMPLANT
POUCH SPECIMEN RETRIEVAL 10MM (ENDOMECHANICALS) ×4 IMPLANT
SEAL CANN UNIV 5-8 DVNC XI (MISCELLANEOUS) ×6 IMPLANT
SEAL XI 5MM-8MM UNIVERSAL (MISCELLANEOUS) ×6
SET TUBE SMOKE EVAC HIGH FLOW (TUBING) ×4 IMPLANT
SOLUTION ELECTROLUBE (MISCELLANEOUS) ×4 IMPLANT
STAPLER CANNULA SEAL DVNC XI (STAPLE) ×2 IMPLANT
STAPLER CANNULA SEAL XI (STAPLE) ×2
SUT MNCRL 4-0 (SUTURE) ×4
SUT MNCRL 4-0 27XMFL (SUTURE) ×4
SUT VIC AB 3-0 SH 27 (SUTURE)
SUT VIC AB 3-0 SH 27X BRD (SUTURE) IMPLANT
SUT VICRYL 0 AB UR-6 (SUTURE) ×2 IMPLANT
SUTURE MNCRL 4-0 27XMF (SUTURE) ×2 IMPLANT
TROCAR XCEL NON-BLD 5MMX100MML (ENDOMECHANICALS) IMPLANT

## 2019-09-22 NOTE — Op Note (Signed)
Preoperative diagnosis: Symptomatic cholelithiasis  Postoperative diagnosis: Symptomatic cholelithiasis  Procedure: Robotic Assisted Laparoscopic Cholecystectomy.   Anesthesia: GETA   Surgeon: Dr. Hazle Quant  Wound Classification: Clean Contaminated  Indications: Patient is a 49 y.o. male developed right upper quadrant pain and on workup was found to have cholelithiasis with a normal common duct . Robotic Assisted Laparoscopic cholecystectomy was elected.  Findings: Critical view of safety achieved Cystic duct and artery identified, ligated and divided Adequate hemostasis  Description of procedure: The patient was placed on the operating table in the supine position. General anesthesia was induced. A time-out was completed verifying correct patient, procedure, site, positioning, and implant(s) and/or special equipment prior to beginning this procedure. An orogastric tube was placed. The abdomen was prepped and draped in the usual sterile fashion.  An incision was made in a natural skin line below the umbilicus.  The fascia was elevated and the Veress needle inserted. Proper position was confirmed by aspiration and saline meniscus test.  The abdomen was insufflated with carbon dioxide to a pressure of 15 mmHg. The patient tolerated insufflation well. A 8-mm trocar was then inserted in optiview fashion.  The laparoscope was inserted and the abdomen inspected. No injuries from initial trocar placement were noted. Additional trocars were then inserted in the following locations: an 8-mm trocar in the left lateral abdomen, and another two 8-mm trocars to the right side of the abdomen 5 cm appart. The umbilical trocar was changed to a 12 mm trocar all under direct visualization. The abdomen was inspected and no abnormalities were found. The table was placed in the reverse Trendelenburg position with the right side up. The robotic arms were docked and target anatomy identified. Instrument inserted  under direct visualization.  Filmy adhesions between the gallbladder and omentum, duodenum and transverse colon were lysed with electrocautery. The dome of the gallbladder was grasped with a prograsp and retracted over the dome of the liver. The infundibulum was also grasped with an atraumatic grasper and retracted toward the right lower quadrant. This maneuver exposed Calot's triangle. The peritoneum overlying the gallbladder infundibulum was then incised and the cystic duct and cystic artery identified and circumferentially dissected. Critical view of safety reviewed before ligating any structure. ICG green used to visualize biliary structures. The cystic duct and cystic artery were then doubly clipped and divided close to the gallbladder.  The gallbladder was then dissected from its peritoneal attachments by electrocautery. Hemostasis was checked and the gallbladder and contained stones were removed using an endoscopic retrieval bag. The gallbladder was passed off the table as a specimen. The gallbladder fossa was copiously irrigated with saline and hemostasis was obtained. There was no evidence of bleeding from the gallbladder fossa or cystic artery or leakage of the bile from the cystic duct stump. Secondary trocars were removed under direct vision. No bleeding was noted. The robotic arms were undoked. The scope was withdrawn and the umbilical trocar removed. The abdomen was allowed to collapse. The fascia of the 10mm trocar sites was closed with figure-of-eight 0 vicryl sutures. The skin was closed with subcuticular sutures of 4-0 monocryl and topical skin adhesive. The orogastric tube was removed.  The patient tolerated the procedure well and was taken to the postanesthesia care unit in stable condition.   Specimen: Gallbladder  Complications: None  EBL: 3 mL

## 2019-09-22 NOTE — Anesthesia Postprocedure Evaluation (Signed)
Anesthesia Post Note  Patient: Brian Durham  Procedure(s) Performed: XI ROBOTIC ASSISTED LAPAROSCOPIC CHOLECYSTECTOMY (N/A Abdomen) INDOCYANINE GREEN FLUORESCENCE IMAGING (ICG)  Patient location during evaluation: PACU Anesthesia Type: General Level of consciousness: awake and alert and oriented Pain management: pain level controlled Vital Signs Assessment: post-procedure vital signs reviewed and stable Respiratory status: spontaneous breathing, nonlabored ventilation and respiratory function stable Cardiovascular status: blood pressure returned to baseline and stable Postop Assessment: no signs of nausea or vomiting Anesthetic complications: no     Last Vitals:  Vitals:   09/22/19 1411 09/22/19 1426  BP: (!) 158/105 (!) 164/106  Pulse: 69 74  Resp: 17 (!) 21  Temp: 36.4 C   SpO2: 100% 100%    Last Pain:  Vitals:   09/22/19 1426  TempSrc:   PainSc: 4                  Laquasia Pincus

## 2019-09-22 NOTE — Discharge Instructions (Signed)
AMBULATORY SURGERY  DISCHARGE INSTRUCTIONS   1) The drugs that you were given will stay in your system until tomorrow so for the next 24 hours you should not:  A) Drive an automobile B) Make any legal decisions C) Drink any alcoholic beverage   2) You may resume regular meals tomorrow.  Today it is better to start with liquids and gradually work up to solid foods.  You may eat anything you prefer, but it is better to start with liquids, then soup and crackers, and gradually work up to solid foods.   3) Please notify your doctor immediately if you have any unusual bleeding, trouble breathing, redness and pain at the surgery site, drainage, fever, or pain not relieved by medication.    4) Additional Instructions:        Please contact your physician with any problems or Same Day Surgery at [phone], Monday through Friday 6 am to 4 pm, or [place] at [place] [facility] number at [phone]. Diet: Resume home heart healthy regular diet.   Activity: No heavy lifting >20 pounds (children, pets, laundry, garbage) or strenuous activity until follow-up, but light activity and walking are encouraged. Do not drive or drink alcohol if taking narcotic pain medications.  Wound care: May shower with soapy water and pat dry (do not rub incisions), but no baths or submerging incision underwater until follow-up. (no swimming)   Medications: Resume all home medications. For mild to moderate pain: acetaminophen (Tylenol) or ibuprofen (if no kidney disease). Combining Tylenol with alcohol can substantially increase your risk of causing liver disease. Narcotic pain medications, if prescribed, can be used for severe pain, though may cause nausea, constipation, and drowsiness. Do not combine Tylenol and Norco within a 6 hour period as Norco contains Tylenol. If you do not need the narcotic pain medication, you do not need to fill the prescription.  Call office ([phone]) at any time if any  questions, worsening pain, fevers/chills, bleeding, drainage from incision site, or other concerns.  

## 2019-09-22 NOTE — Anesthesia Preprocedure Evaluation (Signed)
Anesthesia Evaluation  Patient identified by MRN, date of birth, ID band Patient awake    Reviewed: Allergy & Precautions, NPO status , Patient's Chart, lab work & pertinent test results  History of Anesthesia Complications Negative for: history of anesthetic complications  Airway Mallampati: I  TM Distance: <3 FB Neck ROM: Full    Dental no notable dental hx.    Pulmonary neg pulmonary ROS, neg sleep apnea, neg COPD,    breath sounds clear to auscultation- rhonchi (-) wheezing      Cardiovascular hypertension, Pt. on medications (-) CAD, (-) Past MI, (-) Cardiac Stents and (-) CABG  Rhythm:Regular Rate:Normal - Systolic murmurs and - Diastolic murmurs    Neuro/Psych neg Seizures negative neurological ROS  negative psych ROS   GI/Hepatic Neg liver ROS, hiatal hernia,   Endo/Other  negative endocrine ROSneg diabetes  Renal/GU negative Renal ROS     Musculoskeletal negative musculoskeletal ROS (+)   Abdominal (+) - obese,   Peds  Hematology negative hematology ROS (+)   Anesthesia Other Findings Past Medical History: No date: Fatty liver No date: History of hiatal hernia     Comment:  AT BIRTH.  Surgically repaired No date: History of kidney stones No date: Hypercholesteremia No date: Hypertension   Reproductive/Obstetrics                             Anesthesia Physical Anesthesia Plan  ASA: II  Anesthesia Plan: General   Post-op Pain Management:    Induction: Intravenous  PONV Risk Score and Plan: 1 and Ondansetron, Dexamethasone and Midazolam  Airway Management Planned: Oral ETT  Additional Equipment:   Intra-op Plan:   Post-operative Plan: Extubation in OR  Informed Consent: I have reviewed the patients History and Physical, chart, labs and discussed the procedure including the risks, benefits and alternatives for the proposed anesthesia with the patient or authorized  representative who has indicated his/her understanding and acceptance.     Dental advisory given  Plan Discussed with: CRNA and Anesthesiologist  Anesthesia Plan Comments:         Anesthesia Quick Evaluation

## 2019-09-22 NOTE — Transfer of Care (Signed)
Immediate Anesthesia Transfer of Care Note  Patient: Brian Durham  Procedure(s) Performed: XI ROBOTIC ASSISTED LAPAROSCOPIC CHOLECYSTECTOMY (N/A Abdomen) INDOCYANINE GREEN FLUORESCENCE IMAGING (ICG)  Patient Location: PACU  Anesthesia Type:General  Level of Consciousness: drowsy  Airway & Oxygen Therapy: Patient Spontanous Breathing and Patient connected to face mask oxygen  Post-op Assessment: Report given to RN and Post -op Vital signs reviewed and stable  Post vital signs: Reviewed and stable  Last Vitals:  Vitals Value Taken Time  BP 158/105 09/22/19 1411  Temp 36.4 C 09/22/19 1411  Pulse 73 09/22/19 1416  Resp 21 09/22/19 1416  SpO2 100 % 09/22/19 1416  Vitals shown include unvalidated device data.  Last Pain:  Vitals:   09/22/19 1104  TempSrc: Tympanic  PainSc: 0-No pain         Complications: No apparent anesthesia complications

## 2019-09-22 NOTE — Anesthesia Procedure Notes (Signed)
Procedure Name: Intubation Date/Time: 09/22/2019 12:49 PM Performed by: Lynden Oxford, CRNA Pre-anesthesia Checklist: Patient identified, Emergency Drugs available, Suction available and Patient being monitored Patient Re-evaluated:Patient Re-evaluated prior to induction Oxygen Delivery Method: Circle system utilized Preoxygenation: Pre-oxygenation with 100% oxygen Induction Type: IV induction Ventilation: Mask ventilation without difficulty Laryngoscope Size: McGraph and 4 Grade View: Grade I Tube type: Oral Tube size: 7.0 mm Number of attempts: 1 Airway Equipment and Method: Stylet,  Oral airway and Video-laryngoscopy Placement Confirmation: ETT inserted through vocal cords under direct vision,  positive ETCO2 and breath sounds checked- equal and bilateral Secured at: 21 cm Tube secured with: Tape Dental Injury: Teeth and Oropharynx as per pre-operative assessment

## 2019-09-22 NOTE — Interval H&P Note (Signed)
History and Physical Interval Note:  09/22/2019 12:05 PM  Brian Durham  has presented today for surgery, with the diagnosis of K80.20 Cholelithiasis w/o cholecystitis.  The various methods of treatment have been discussed with the patient and family. After consideration of risks, benefits and other options for treatment, the patient has consented to  Procedure(s): XI ROBOTIC ASSISTED LAPAROSCOPIC CHOLECYSTECTOMY (N/A) as a surgical intervention.  The patient's history has been reviewed, patient examined, no change in status, stable for surgery.  I have reviewed the patient's chart and labs.  Questions were answered to the patient's satisfaction.     Carolan Shiver

## 2019-09-24 LAB — SURGICAL PATHOLOGY

## 2019-10-06 ENCOUNTER — Other Ambulatory Visit: Payer: Self-pay | Admitting: General Surgery

## 2019-10-11 ENCOUNTER — Other Ambulatory Visit
Admission: RE | Admit: 2019-10-11 | Discharge: 2019-10-11 | Disposition: A | Discharge status: Home / Self Care | Payer: BC Managed Care – PPO | Source: Ambulatory Visit | Attending: General Surgery | Admitting: General Surgery

## 2019-10-11 ENCOUNTER — Other Ambulatory Visit: Payer: Self-pay

## 2019-10-11 DIAGNOSIS — Z01812 Encounter for preprocedural laboratory examination: Secondary | ICD-10-CM | POA: Insufficient documentation

## 2019-10-11 DIAGNOSIS — Z20822 Contact with and (suspected) exposure to covid-19: Secondary | ICD-10-CM | POA: Insufficient documentation

## 2019-10-12 LAB — SARS CORONAVIRUS 2 (TAT 6-24 HRS): SARS Coronavirus 2: NEGATIVE

## 2019-10-13 ENCOUNTER — Ambulatory Visit: Payer: BC Managed Care – PPO | Admitting: Anesthesiology

## 2019-10-13 ENCOUNTER — Encounter
Admission: RE | Disposition: A | Discharge status: Home / Self Care | Payer: Self-pay | Source: Home / Self Care | Attending: General Surgery

## 2019-10-13 ENCOUNTER — Encounter: Payer: Self-pay | Admitting: General Surgery

## 2019-10-13 ENCOUNTER — Other Ambulatory Visit: Payer: Self-pay

## 2019-10-13 ENCOUNTER — Ambulatory Visit
Admission: RE | Admit: 2019-10-13 | Discharge: 2019-10-13 | Disposition: A | Discharge status: Home / Self Care | Payer: BC Managed Care – PPO | Attending: General Surgery | Admitting: General Surgery

## 2019-10-13 DIAGNOSIS — E7800 Pure hypercholesterolemia, unspecified: Secondary | ICD-10-CM | POA: Diagnosis not present

## 2019-10-13 DIAGNOSIS — I10 Essential (primary) hypertension: Secondary | ICD-10-CM | POA: Diagnosis not present

## 2019-10-13 DIAGNOSIS — Z1211 Encounter for screening for malignant neoplasm of colon: Secondary | ICD-10-CM | POA: Diagnosis not present

## 2019-10-13 DIAGNOSIS — Z79899 Other long term (current) drug therapy: Secondary | ICD-10-CM | POA: Diagnosis not present

## 2019-10-13 DIAGNOSIS — K760 Fatty (change of) liver, not elsewhere classified: Secondary | ICD-10-CM | POA: Diagnosis not present

## 2019-10-13 HISTORY — PX: COLONOSCOPY WITH PROPOFOL: SHX5780

## 2019-10-13 SURGERY — COLONOSCOPY WITH PROPOFOL
Anesthesia: General

## 2019-10-13 MED ORDER — PROPOFOL 10 MG/ML IV BOLUS
INTRAVENOUS | Status: DC | PRN
  Administered 2019-10-13: 80 mg via INTRAVENOUS

## 2019-10-13 MED ORDER — PROPOFOL 500 MG/50ML IV EMUL
INTRAVENOUS | Status: AC
  Filled 2019-10-13: qty 50

## 2019-10-13 MED ORDER — SODIUM CHLORIDE 0.9 % IV SOLN: INTRAVENOUS | Status: DC

## 2019-10-13 MED ORDER — PROPOFOL 500 MG/50ML IV EMUL
INTRAVENOUS | Status: DC | PRN
  Administered 2019-10-13: 160 ug/kg/min via INTRAVENOUS

## 2019-10-13 MED ORDER — GLYCOPYRROLATE 0.2 MG/ML IJ SOLN
INTRAMUSCULAR | Status: AC
  Filled 2019-10-13: qty 1

## 2019-10-13 NOTE — Anesthesia Preprocedure Evaluation (Addendum)
Anesthesia Evaluation  Patient identified by MRN, date of birth, ID band Patient awake    Reviewed: Allergy & Precautions, H&P , NPO status , Patient's Chart, lab work & pertinent test results  Airway Mallampati: II  TM Distance: >3 FB Neck ROM: full    Dental  (+) Teeth Intact   Pulmonary neg pulmonary ROS, neg COPD,           Cardiovascular hypertension, (-) angina(-) Past MI (-) dysrhythmias      Neuro/Psych negative neurological ROS  negative psych ROS   GI/Hepatic Neg liver ROS, hiatal hernia (repaired),   Endo/Other  negative endocrine ROS  Renal/GU negative Renal ROS  negative genitourinary   Musculoskeletal   Abdominal   Peds  Hematology negative hematology ROS (+)   Anesthesia Other Findings Past Medical History: No date: Fatty liver No date: History of hiatal hernia     Comment:  AT BIRTH.  Surgically repaired No date: History of kidney stones No date: Hypercholesteremia No date: Hypertension  Past Surgical History: No date: GANGLION CYST EXCISION     Comment:  x 2 No date: HERNIA REPAIR     Comment:  hiatal hernia repaired at birth     Reproductive/Obstetrics negative OB ROS                            Anesthesia Physical Anesthesia Plan  ASA: II  Anesthesia Plan: General   Post-op Pain Management:    Induction:   PONV Risk Score and Plan: Propofol infusion and TIVA  Airway Management Planned: Natural Airway and Nasal Cannula  Additional Equipment:   Intra-op Plan:   Post-operative Plan:   Informed Consent: I have reviewed the patients History and Physical, chart, labs and discussed the procedure including the risks, benefits and alternatives for the proposed anesthesia with the patient or authorized representative who has indicated his/her understanding and acceptance.     Dental Advisory Given  Plan Discussed with: Anesthesiologist  Anesthesia Plan  Comments:         Anesthesia Quick Evaluation

## 2019-10-13 NOTE — Transfer of Care (Signed)
Immediate Anesthesia Transfer of Care Note  Patient: Brian Durham  Procedure(s) Performed: COLONOSCOPY WITH PROPOFOL (N/A )  Patient Location: PACU  Anesthesia Type:General  Level of Consciousness: awake and alert   Airway & Oxygen Therapy: Patient Spontanous Breathing and Patient connected to face mask oxygen  Post-op Assessment: Report given to RN and Post -op Vital signs reviewed and stable  Post vital signs: Reviewed and stable  Last Vitals:  Vitals Value Taken Time  BP 106/75 10/13/19 0853  Temp 36.7 C 10/13/19 0853  Pulse 70 10/13/19 0854  Resp 13 10/13/19 0854  SpO2 98 % 10/13/19 0854  Vitals shown include unvalidated device data.  Last Pain:  Vitals:   10/13/19 0853  TempSrc: Temporal  PainSc: 0-No pain         Complications: No apparent anesthesia complications

## 2019-10-13 NOTE — Op Note (Signed)
[facility] Gastroenterology Patient Name: Brian Durham Procedure Date: 10/13/2019 7:52 AM MRN: [id number] Account #: [id number] Date of Birth: 1970/08/26 Admit Type: Outpatient Age: 49 Room: [facility] ENDO ROOM 1 Gender: Male Note Status: Finalized Procedure:             Colonoscopy Indications:           Screening for colorectal malignant neoplasm Providers:             Robert Bellow, MD Medicines:             Monitored Anesthesia Care Complications:         No immediate complications. Procedure:             Pre-Anesthesia Assessment:                        - Prior to the procedure, a History and Physical was                         performed, and patient medications, allergies and                         sensitivities were reviewed. The patient's tolerance                         of previous anesthesia was reviewed.                        - The risks and benefits of the procedure and the                         sedation options and risks were discussed with the                         patient. All questions were answered and informed                         consent was obtained.                        After obtaining informed consent, the colonoscope was                         passed under direct vision. Throughout the procedure,                         the patient's blood pressure, pulse, and oxygen                         saturations were monitored continuously. The                         Colonoscope was introduced through the anus and                         advanced to the the cecum, identified by appendiceal                         orifice and ileocecal valve. The colonoscopy was  technically difficult and complex due to significant                         looping and a tortuous colon. Successful completion of                         the procedure was aided by using manual pressure. The                         patient tolerated  the procedure well. The quality of                         the bowel preparation was excellent. Findings:      The entire examined colon appeared normal on direct and retroflexion       views. Impression:            - The entire examined colon is normal on direct and                         retroflexion views.                        - No specimens collected. Recommendation:        - Repeat colonoscopy in 10 years for screening                         purposes. Procedure Code(s):     --- Professional ---                        [phone], Colonoscopy, flexible; diagnostic, including                         collection of specimen(s) by brushing or washing, when                         performed (separate procedure) Diagnosis Code(s):     --- Professional ---                        Z12.11, Encounter for screening for malignant neoplasm                         of colon CPT copyright 2019 American Medical Association. All rights reserved. The codes documented in this report are preliminary and upon coder review may  be revised to meet current compliance requirements. Earline Mayotte, MD 10/13/2019 8:51:26 AM This report has been signed electronically. Number of Addenda: 0 Note Initiated On: 10/13/2019 7:52 AM Scope Withdrawal Time: 0 hours 9 minutes 15 seconds  Total Procedure Duration: 0 hours 28 minutes 43 seconds  Estimated Blood Loss:  Estimated blood loss: none.      [facility]

## 2019-10-13 NOTE — Anesthesia Procedure Notes (Signed)
Performed by: Rodel Glaspy, CRNA Pre-anesthesia Checklist: Patient identified, Emergency Drugs available, Suction available, Patient being monitored and Timeout performed Patient Re-evaluated:Patient Re-evaluated prior to induction Oxygen Delivery Method: Nasal cannula Induction Type: IV induction       

## 2019-10-13 NOTE — H&P (Signed)
Brian Durham [id number] [birth year]     HPI:  49 year old male for a screening colonoscopy.  Tolerated prep well.   Medications Prior to Admission  Medication Sig Dispense Refill Last Dose  . Cyanocobalamin (B-12 PO) Take 1 capsule by mouth 4 (four) times a week.     Marland Kitchen ibuprofen (ADVIL) 200 MG tablet Take 200 mg by mouth daily as needed for moderate pain.     Marland Kitchen lisinopril (ZESTRIL) 10 MG tablet Take 10 mg by mouth daily.     . Multiple Vitamin (MULTIVITAMIN WITH MINERALS) TABS tablet Take 1 tablet by mouth 4 (four) times a week.      No Known Allergies Past Medical History:  Diagnosis Date  . Fatty liver   . History of hiatal hernia    AT BIRTH.  Surgically repaired  . History of kidney stones   . Hypercholesteremia   . Hypertension    Past Surgical History:  Procedure Laterality Date  . GANGLION CYST EXCISION     x 2  . HERNIA REPAIR     hiatal hernia repaired at birth   Social History   Socioeconomic History  . Marital status: Married    Spouse name: [name]  . Number of children: 1  . Years of education: Not on file  . Highest education level: Not on file  Occupational History  . Occupation: truck driver (night shift)  Tobacco Use  . Smoking status: Never Smoker  . Smokeless tobacco: Never Used  Substance and Sexual Activity  . Alcohol use: Not Currently  . Drug use: Never  . Sexual activity: Yes  Other Topics Concern  . Not on file  Social History Narrative   Patient trying to change his dietary habits but has some difficulty d/t the nature of his job.   Works nights as a Naval architect for [employer]   Social Determinants of Corporate investment banker Strain:   . Difficulty of Paying Living Expenses:   Food Insecurity:   . Worried About Programme researcher, broadcasting/film/video in the Last Year:   . Barista in the Last Year:   Transportation Needs:   . Freight forwarder (Medical):   Marland Kitchen Lack of Transportation (Non-Medical):   Physical Activity:   . Days of  Exercise per Week:   . Minutes of Exercise per Session:   Stress:   . Feeling of Stress :   Social Connections:   . Frequency of Communication with Friends and Family:   . Frequency of Social Gatherings with Friends and Family:   . Attends Religious Services:   . Active Member of Clubs or Organizations:   . Attends Banker Meetings:   Marland Kitchen Marital Status:   Intimate Partner Violence:   . Fear of Current or Ex-Partner:   . Emotionally Abused:   Marland Kitchen Physically Abused:   . Sexually Abused:    Social History   Social History Narrative   Patient trying to change his dietary habits but has some difficulty d/t the nature of his job.   Works nights as a Naval architect for [employer]     ROS: Negative.     PE: HEENT: Negative. Lungs: Clear. Cardio: RR.  Assessment/Plan:  Proceed with planned endoscopy.  Merrily Pew [facility] 10/13/2019

## 2019-10-13 NOTE — Anesthesia Postprocedure Evaluation (Signed)
Anesthesia Post Note  Patient: Brian Durham  Procedure(s) Performed: COLONOSCOPY WITH PROPOFOL (N/A )  Patient location during evaluation: PACU Anesthesia Type: General Level of consciousness: awake and alert Pain management: pain level controlled Vital Signs Assessment: post-procedure vital signs reviewed and stable Respiratory status: spontaneous breathing, nonlabored ventilation and respiratory function stable Cardiovascular status: blood pressure returned to baseline and stable Postop Assessment: no apparent nausea or vomiting Anesthetic complications: no     Last Vitals:  Vitals:   10/13/19 0853 10/13/19 0923  BP: 106/75 (!) 117/92  Pulse: 74   Resp: 15   Temp: 36.7 C   SpO2: 97%     Last Pain:  Vitals:   10/13/19 0923  TempSrc:   PainSc: 0-No pain                 Karleen Hampshire

## 2019-10-14 ENCOUNTER — Encounter: Payer: Self-pay | Admitting: *Deleted

## 2021-07-24 IMAGING — US US ABDOMEN LIMITED
1 series · 14 of 25 positions shown · non-contrast
Comparison: 06/09/2017

CLINICAL DATA: RIGHT upper quadrant pain for 3 days especially
after eating, 4-5 episodes over last 6 months, cholelithiasis
question cholecystitis

EXAM:
ULTRASOUND ABDOMEN LIMITED RIGHT UPPER QUADRANT

[Series 1: us abdomen limited · 0.26mm/px · 14 of 54 slices shown]
[im 1/54]
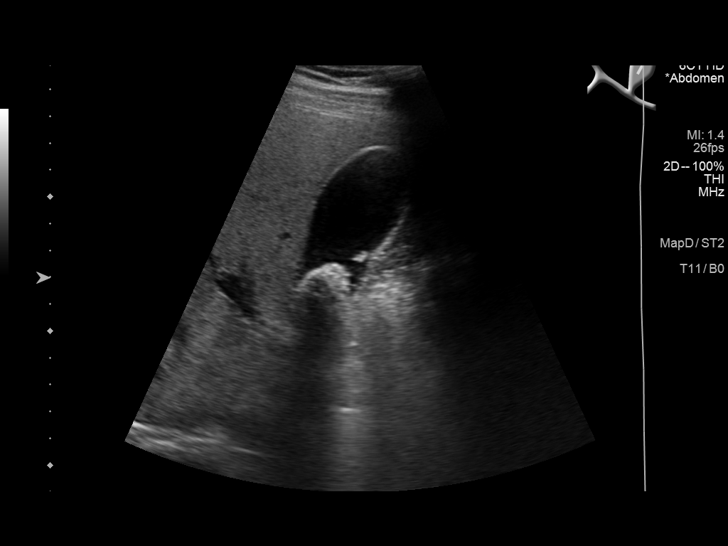
[im 5/54]
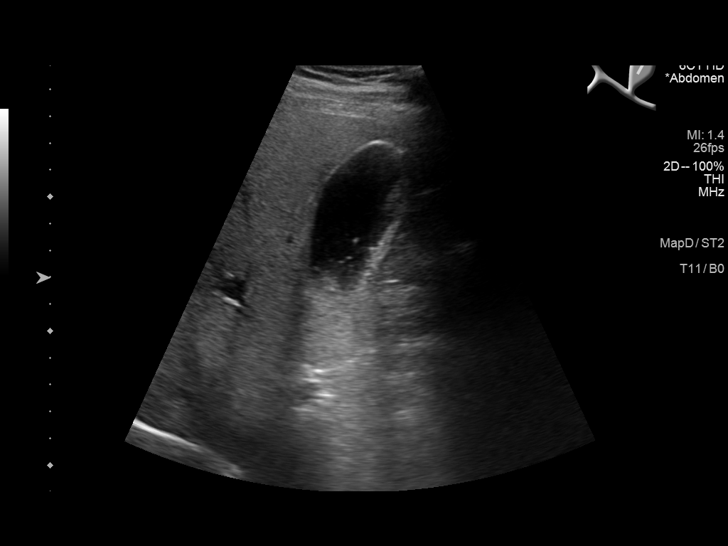
[im 9/54]
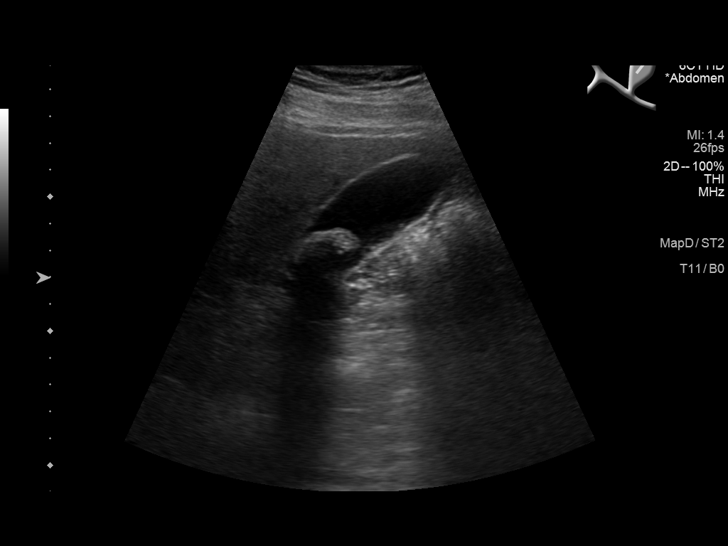
[im 14/54]
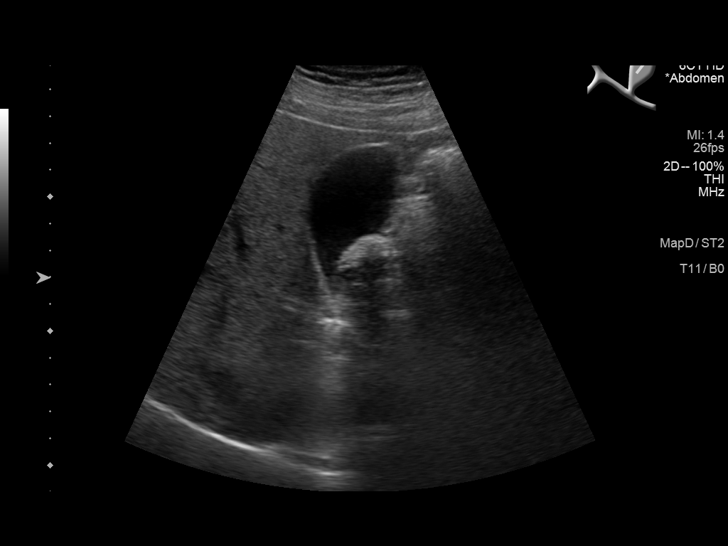
[im 18/54]
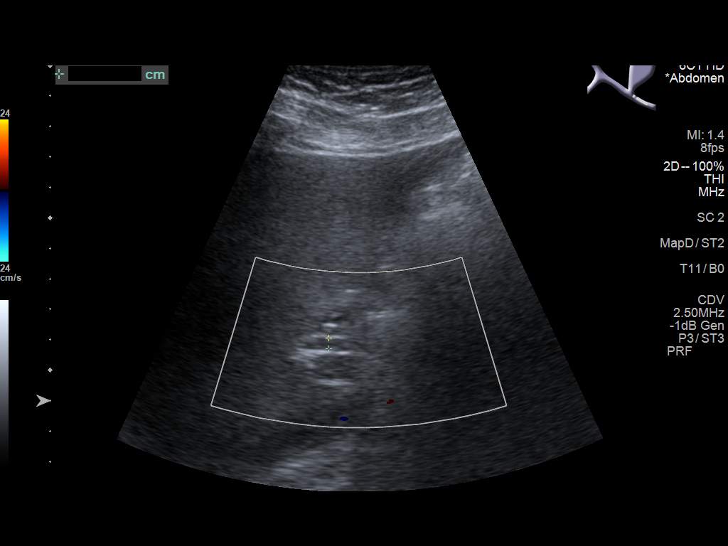
[im 20/54]
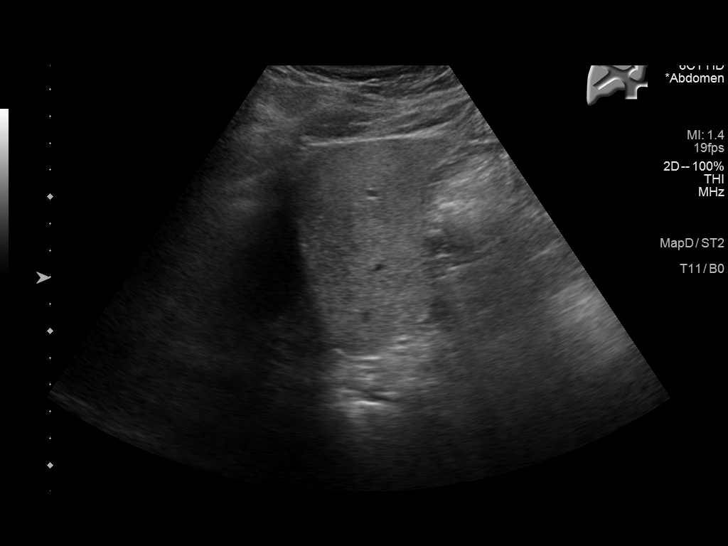
[im 25/54]
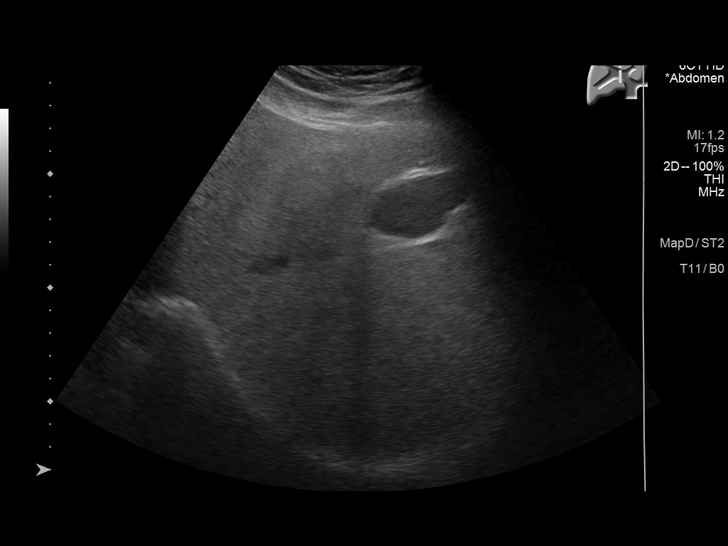
[im 29/54]
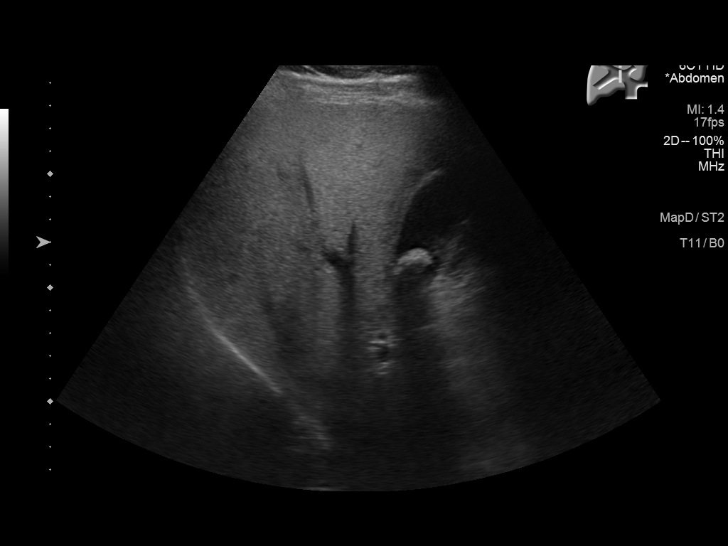
[im 34/54]
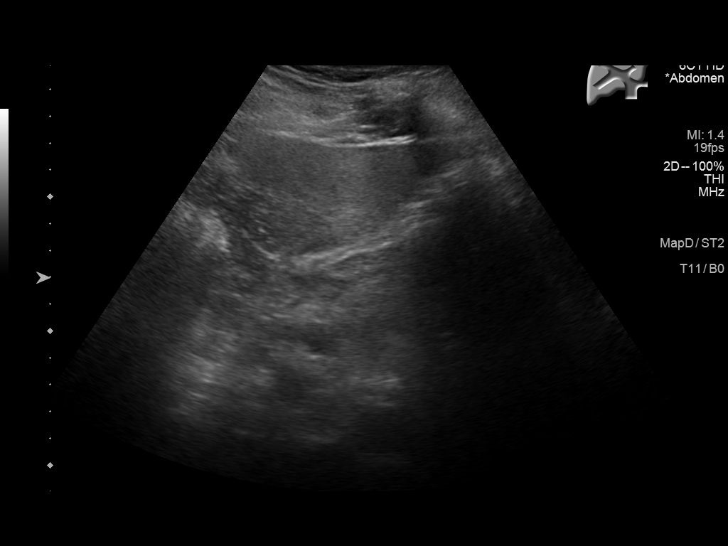
[im 36/54]
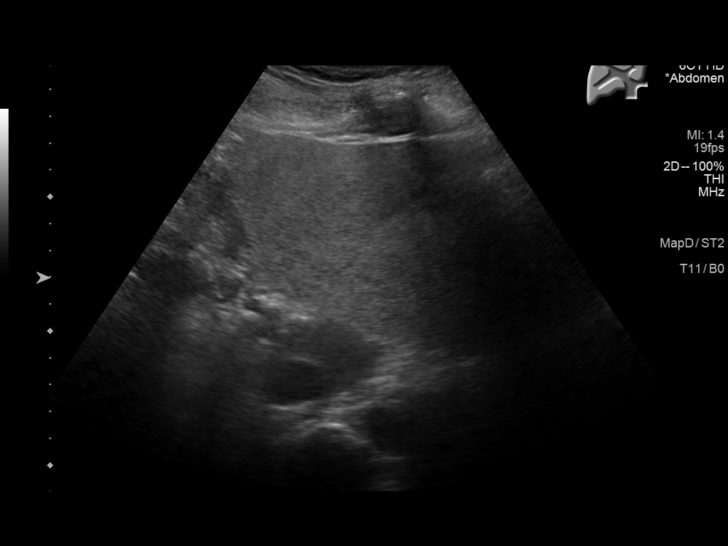
[im 40/54]
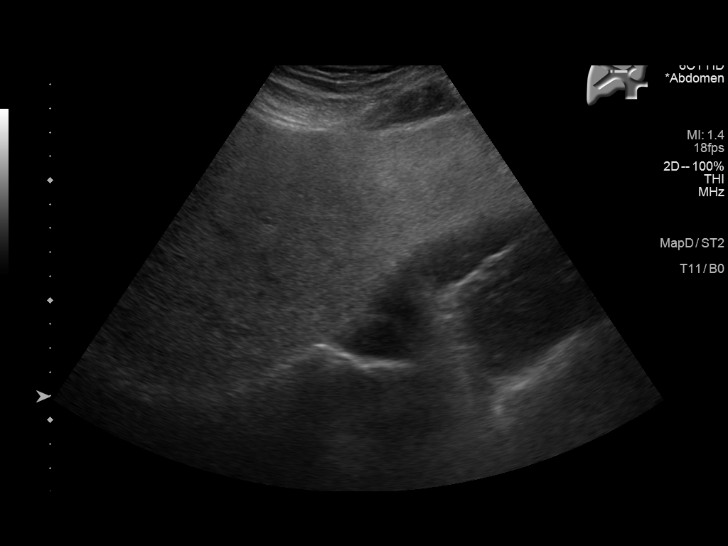
[im 45/54]
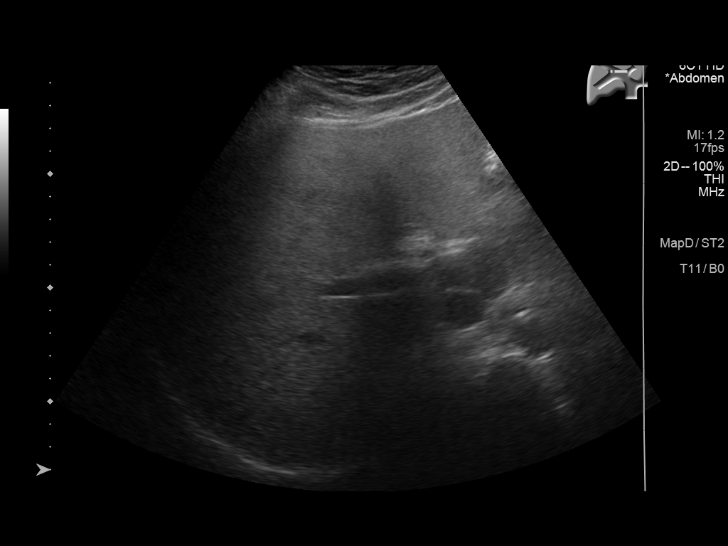
[im 49/54]
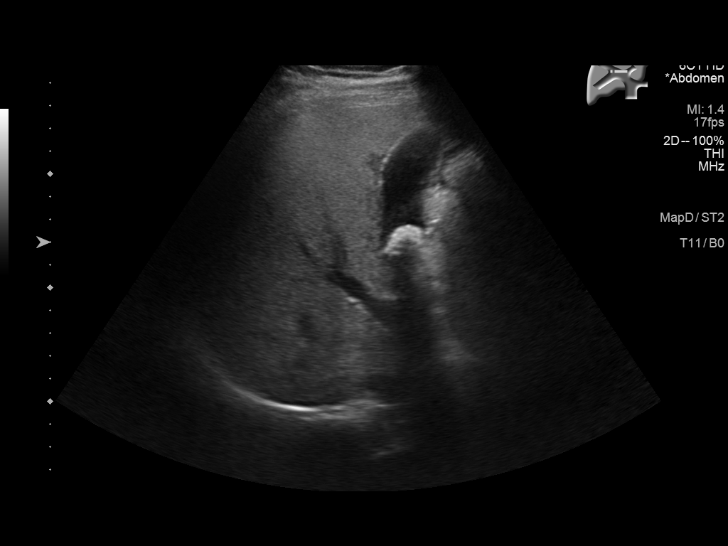
[im 54/54]
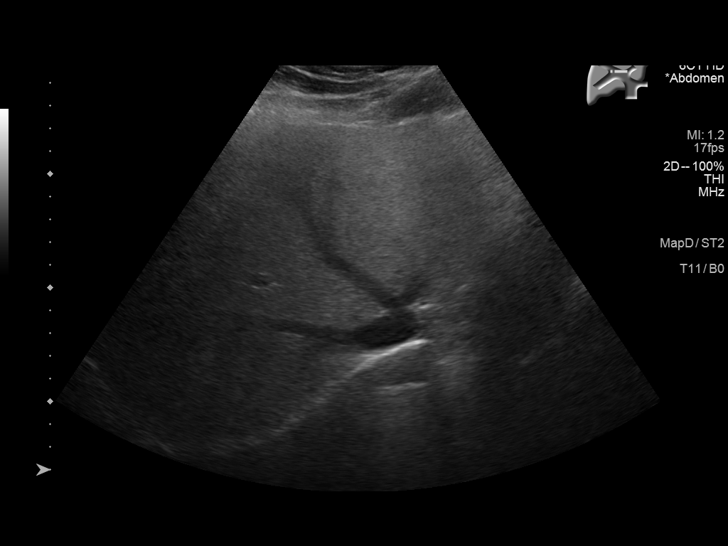

[14 of 25 positions shown; findings below may reference images not displayed]

FINDINGS: Gallbladder:

Shadowing calculi within gallbladder up to 2.5 cm diameter. No
gallbladder wall thickening, pericholecystic fluid or sonographic
Murphy sign.

Common bile duct:

Diameter: 4 mm, normal

Liver:

Echogenic parenchyma, likely fatty infiltration though this can be
seen with cirrhosis and certain infiltrative disorders. No focal
hepatic mass or nodularity grossly identified, with mild degradation
of intrahepatic detail secondary to sound attenuation. Portal vein
is patent on color Doppler imaging with normal direction of blood
flow towards the liver.

Other: No RIGHT upper quadrant free fluid.
IMPRESSION: Fatty infiltration of liver.

Cholelithiasis without evidence of acute cholecystitis or biliary
dilatation.
